# Patient Record
Sex: Female | Born: 1965 | Race: White | Hispanic: No | Marital: Married | Smoking: Never smoker
Health system: Southern US, Community
[De-identification: ages and names within clinical notes are randomized; demographics above are authoritative.]

## PROBLEM LIST (undated history)

## (undated) DIAGNOSIS — F32A Depression, unspecified: Secondary | ICD-10-CM

## (undated) DIAGNOSIS — F419 Anxiety disorder, unspecified: Secondary | ICD-10-CM

## (undated) DIAGNOSIS — F329 Major depressive disorder, single episode, unspecified: Secondary | ICD-10-CM

## (undated) DIAGNOSIS — C50919 Malignant neoplasm of unspecified site of unspecified female breast: Secondary | ICD-10-CM

## (undated) DIAGNOSIS — F99 Mental disorder, not otherwise specified: Secondary | ICD-10-CM

## (undated) DIAGNOSIS — E039 Hypothyroidism, unspecified: Secondary | ICD-10-CM

## (undated) HISTORY — PX: TONSILLECTOMY: SUR1361

## (undated) HISTORY — DX: Depression, unspecified: F32.A

## (undated) HISTORY — DX: Mental disorder, not otherwise specified: F99

## (undated) HISTORY — DX: Hypothyroidism, unspecified: E03.9

## (undated) HISTORY — DX: Major depressive disorder, single episode, unspecified: F32.9

---

## 2008-03-14 ENCOUNTER — Ambulatory Visit: Payer: Self-pay | Admitting: Obstetrics & Gynecology

## 2010-01-01 ENCOUNTER — Ambulatory Visit: Payer: Self-pay | Admitting: Otolaryngology

## 2010-12-13 ENCOUNTER — Emergency Department: Payer: Self-pay | Admitting: Emergency Medicine

## 2011-05-28 ENCOUNTER — Ambulatory Visit: Payer: Self-pay | Admitting: Otolaryngology

## 2011-07-15 HISTORY — PX: BREAST SURGERY: SHX581

## 2011-09-26 DIAGNOSIS — D0512 Intraductal carcinoma in situ of left breast: Secondary | ICD-10-CM | POA: Insufficient documentation

## 2011-09-27 ENCOUNTER — Telehealth (HOSPITAL_COMMUNITY): Payer: Self-pay | Admitting: Licensed Clinical Social Worker

## 2011-09-27 ENCOUNTER — Encounter (HOSPITAL_COMMUNITY): Payer: Self-pay | Admitting: Licensed Clinical Social Worker
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
## 2011-09-28 ENCOUNTER — Inpatient Hospital Stay (HOSPITAL_COMMUNITY)
Admission: RE | Admit: 2011-09-28 | Discharge: 2011-10-03 | DRG: 885 | Disposition: A | Discharge status: Home / Self Care | Payer: Managed Care, Other (non HMO) | Source: Ambulatory Visit | Attending: Psychiatry | Admitting: Psychiatry

## 2011-09-28 DIAGNOSIS — F3289 Other specified depressive episodes: Secondary | ICD-10-CM

## 2011-09-28 DIAGNOSIS — F329 Major depressive disorder, single episode, unspecified: Secondary | ICD-10-CM

## 2011-09-28 DIAGNOSIS — Z91199 Patient's noncompliance with other medical treatment and regimen due to unspecified reason: Secondary | ICD-10-CM

## 2011-09-28 DIAGNOSIS — Z9119 Patient's noncompliance with other medical treatment and regimen: Secondary | ICD-10-CM

## 2011-09-28 DIAGNOSIS — G8929 Other chronic pain: Secondary | ICD-10-CM

## 2011-09-28 DIAGNOSIS — E039 Hypothyroidism, unspecified: Secondary | ICD-10-CM

## 2011-09-28 DIAGNOSIS — F200 Paranoid schizophrenia: Principal | ICD-10-CM | POA: Diagnosis present

## 2011-09-28 DIAGNOSIS — Z79899 Other long term (current) drug therapy: Secondary | ICD-10-CM

## 2011-09-28 DIAGNOSIS — C50919 Malignant neoplasm of unspecified site of unspecified female breast: Secondary | ICD-10-CM | POA: Diagnosis present

## 2011-09-28 DIAGNOSIS — M549 Dorsalgia, unspecified: Secondary | ICD-10-CM

## 2011-09-28 MED ORDER — RISPERIDONE 0.5 MG PO TBDP
0.5000 mg | ORAL_TABLET | ORAL | Status: DC | PRN
  Administered 2011-09-29: 0.5 mg via ORAL
  Filled 2011-09-28: qty 1

## 2011-09-28 MED ORDER — ACETAMINOPHEN 325 MG PO TABS
650.0000 mg | ORAL_TABLET | Freq: Four times a day (QID) | ORAL | Status: DC | PRN
  Administered 2011-09-28 – 2011-10-02 (×6): 650 mg via ORAL

## 2011-09-28 MED ORDER — MAGNESIUM HYDROXIDE 400 MG/5ML PO SUSP: 30.0000 mL | Freq: Every day | ORAL | Status: DC | PRN

## 2011-09-28 MED ORDER — RISPERIDONE 2 MG PO TBDP
2.0000 mg | ORAL_TABLET | Freq: Every day | ORAL | Status: DC
  Administered 2011-09-28 – 2011-09-29 (×2): 2 mg via ORAL
  Filled 2011-09-28 (×4): qty 1
  Filled 2011-09-28: qty 2

## 2011-09-28 MED ORDER — ALUM & MAG HYDROXIDE-SIMETH 200-200-20 MG/5ML PO SUSP: 30.0000 mL | ORAL | Status: DC | PRN

## 2011-09-28 MED ORDER — LEVOTHYROXINE SODIUM 75 MCG PO TABS
75.0000 ug | ORAL_TABLET | Freq: Every day | ORAL | Status: DC
  Administered 2011-09-28 – 2011-10-03 (×6): 75 ug via ORAL
  Filled 2011-09-28 (×8): qty 1

## 2011-09-28 MED ORDER — TRAMADOL HCL 50 MG PO TABS
50.0000 mg | ORAL_TABLET | Freq: Two times a day (BID) | ORAL | Status: DC
  Administered 2011-09-28 – 2011-09-29 (×3): 50 mg via ORAL
  Filled 2011-09-28 (×7): qty 1
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
## 2011-09-28 NOTE — Progress Notes (Signed)
Patient ID: Cassie Ruiz, female   DOB: 17-Apr-1966, 46 y.o.   MRN: [id number]   [facility] Group Notes:  (Counselor/Nursing/MHT/Case Management/Adjunct)  09/28/2011 11 AM  Type of Therapy:  Group Therapy, Dance/Movement Therapy   Participation Level:  Did Not Attend  Kym Groom

## 2011-09-28 NOTE — H&P (Signed)
  Pt was seen by me today and I agree with the key elements documented in H&P.  

## 2011-09-28 NOTE — Progress Notes (Addendum)
Pt has been isolative to her room today   She believes she has necrotic faschitis and breast cancer  She has not attended groups and does not interact with others  She has a tense affect and is anxious  Her appetite is good and she has otherwise been cooperative  Verbal support given  Medications administered and effectiveness monitored  Q 15 min checks  Pt safe at present  Addendum   Pt does have radiation tatoos on her breast and claims to start treatment on march 27 for breast cancer

## 2011-09-28 NOTE — Progress Notes (Signed)
Pt is 46 year old Caucasion female admitted to the services of Dr. Allena Katz for delusional thinking and paranoia.  Pt states that two people, whom she names, are trying to kill her.  Pt felt that they put necrotizing fasciitis in her tramadol and told ED that she had to have IV vancomycin or she would just lay down and die.  Pt's husband stated that Pt started decompensating when her grandmother, whom she was the primary caregiver for, died in June 26, 2023.  Pt was hospitalized in January at [facility] for similar complaints.  Pt stopped taking her Risperdal soon after this due to side effects that she found unpleasant.  Pt is cooperative on admission, no complaints voiced.  Pt lists  Her husband Cassie Ruiz as her emergency contact [phone]

## 2011-09-28 NOTE — Tx Team (Addendum)
Initial Interdisciplinary Treatment Plan  PATIENT STRENGTHS: (choose at least two) Average or above average intelligence Capable of independent living  PATIENT STRESSORS: Health problems Loss of Grandmother* Marital or family conflict Medication change or noncompliance   PROBLEM LIST: Problem List/Patient Goals Date to be addressed Date deferred Reason deferred Estimated date of resolution  Psychosis, paranoia  09/28/11                                                      DISCHARGE CRITERIA:  Improved stabilization in mood, thinking, and/or behavior Motivation to continue treatment in a less acute level of care Need for constant or close observation no longer present Verbal commitment to aftercare and medication compliance  PRELIMINARY DISCHARGE PLAN: Attend aftercare/continuing care group Return to previous living arrangement  PATIENT/FAMIILY INVOLVEMENT: This treatment plan has been presented to and reviewed with the patient, Rees Matura.  The patient and family have been given the opportunity to ask questions and make suggestions.  Juliann Pares 09/28/2011, 3:14 AM
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
## 2011-09-29 DIAGNOSIS — F200 Paranoid schizophrenia: Secondary | ICD-10-CM

## 2011-09-29 LAB — DIFFERENTIAL
Basophils Absolute: 0.1 10*3/uL (ref 0.0–0.1)
Eosinophils Relative: 1 % (ref 0–5)
Lymphocytes Relative: 22 % (ref 12–46)
Monocytes Absolute: 1.1 10*3/uL — ABNORMAL HIGH (ref 0.1–1.0)
Monocytes Relative: 9 % (ref 3–12)

## 2011-09-29 LAB — CBC
HCT: 35.8 % — ABNORMAL LOW (ref 36.0–46.0)
Hemoglobin: 12 g/dL (ref 12.0–15.0)
MCHC: 33.5 g/dL (ref 30.0–36.0)
MCV: 89.3 fL (ref 78.0–100.0)
RDW: 14.2 % (ref 11.5–15.5)
WBC: 11.4 10*3/uL — ABNORMAL HIGH (ref 4.0–10.5)

## 2011-09-29 LAB — URINALYSIS, ROUTINE W REFLEX MICROSCOPIC
Glucose, UA: NEGATIVE mg/dL
Ketones, ur: NEGATIVE mg/dL
Leukocytes, UA: NEGATIVE
Nitrite: NEGATIVE
Specific Gravity, Urine: 1.027 (ref 1.005–1.030)
pH: 6.5 (ref 5.0–8.0)

## 2011-09-29 LAB — COMPREHENSIVE METABOLIC PANEL
ALT: 19 U/L (ref 0–35)
Albumin: 3.6 g/dL (ref 3.5–5.2)
Alkaline Phosphatase: 76 U/L (ref 39–117)
Potassium: 4 mEq/L (ref 3.5–5.1)
Sodium: 141 mEq/L (ref 135–145)
Total Protein: 6.6 g/dL (ref 6.0–8.3)

## 2011-09-29 LAB — PREGNANCY, URINE: Preg Test, Ur: NEGATIVE

## 2011-09-29 MED ORDER — TRAMADOL HCL 50 MG PO TABS
100.0000 mg | ORAL_TABLET | Freq: Two times a day (BID) | ORAL | Status: DC
  Administered 2011-09-29 – 2011-10-03 (×8): 100 mg via ORAL
  Filled 2011-09-29 (×10): qty 2

## 2011-09-29 MED ORDER — TRAZODONE HCL 100 MG PO TABS
100.0000 mg | ORAL_TABLET | Freq: Every day | ORAL | Status: DC
  Administered 2011-09-29 – 2011-10-02 (×4): 100 mg via ORAL
  Filled 2011-09-29 (×6): qty 1
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
## 2011-09-29 NOTE — Progress Notes (Signed)
Pt resting in bed with eyes closed when I took over her care at 2330.  Respirations even and unlabored, no distress noted.

## 2011-09-29 NOTE — Progress Notes (Signed)
Recreation Therapy Notes  09/29/2011         Time: 0930      Group Topic/Focus: The focus of this group is on discussing various styles of communication and communicating assertively using 'I' (feeling) statements.  Participation Level: Active  Participation Quality: Appropriate and Attentive  Affect: Appropriate  Cognitive: Alert   Additional Comments: Patient able to participate appropriately in the activity, able to formulate appropriate I-statements to share her feelings with the group. Patient reports feeling scared about her upcoming radiation treatment, which will begin on the 27th.   Jamyron Redd 09/29/2011 11:26 AM

## 2011-09-29 NOTE — Progress Notes (Signed)
Pt. Went to recreational therapy and reported that she enjoyed it.  No concerns or issues voiced at present.  Writer will continue to assess.
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
## 2011-09-29 NOTE — Progress Notes (Signed)
Patient ID: Cassie Ruiz, female   DOB: October 13, 1965, 46 y.o.   MRN: [id number] She has been up and about and to parts of the groups. She has requested and received PRN medication for anxiety. She denies having delusions .
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
## 2011-09-29 NOTE — Progress Notes (Signed)
[facility] Group Notes:  (Counselor/Nursing/MHT/Case Management/Adjunct)  09/29/2011 2:14 PM 1:15 PM Group  Type of Therapy:  Group Therapy  Participation Level:  Active  Participation Quality:  Appropriate, Attentive and Supportive  Affect:  Appropriate  Cognitive:  Alert and Oriented  Insight:  Good  Engagement in Group:  Good  Engagement in Therapy:  Good  Modes of Intervention:  Problem-solving, Support and Exploratory  Summary of Progress/Problems: Pt verbalized that she was learning how to take a stand. Pt stated that she has been able to do that by reaching out for help. Pt verbalized that she would like for her husband and her mother to listen to her more. Pt states that she feels her mom is one of her supports.   Johnna Bollier, Randal Buba 09/29/2011, 2:14 PM

## 2011-09-29 NOTE — BHH Counselor (Signed)
Adult Services Patient-Family Contact/Session  Attendees:  Hardie Shackleton Tangen(Husband), Osborn Coho  Goal(s):  Collateral   Safety Concerns:    Narrative:  Husband reported that pt had a nervous breakdown in January. Husband feels as though pt has been experiencing symptoms for years. Husband reports that pt's niece was killed four years ago in [place] and that her brother recently started drinking. Husband reports that pt took care of her grandmother and her aunt for ten years. Husband reports that pt was staying up all night, not sleeping, and was becoming forgetful. Husband verbalized that him and pt were able to attend one marriage counseling session. Husband did report that pt stopped taking her medications. Husband reports that he has not and is not currently cheating on his wife. He reports that he has been married to wife for 27 years and has been with his wife for 33 years. Husband reports that brother has a mental illness at this time. Husband reports that wife recently had cancer removed from her left breast and is expected to follow up with the Dr. Hilda Lias in [place] on March 28th.   Barrier(s):  Husband reports that wife is extremely paranoid.   Interventions:  Requested collateral  Recommendation(s):    Follow-up Required:  No  Explanation:    Gerrit Heck 09/29/2011, 10:23 PM

## 2011-09-30 MED ORDER — RISPERIDONE 2 MG PO TABS
2.0000 mg | ORAL_TABLET | Freq: Every day | ORAL | Status: DC
  Administered 2011-09-30 – 2011-10-02 (×3): 2 mg via ORAL
  Filled 2011-09-30 (×4): qty 1

## 2011-09-30 NOTE — Progress Notes (Signed)
[facility] Group Notes:  (Counselor/Nursing/MHT/Case Management/Adjunct)  09/30/2011 4:02 PM  Type of Therapy:  Group Therapy  Participation Level:  Active  Participation Quality:  Attentive and Sharing  Affect:  Blunted  Cognitive:  Oriented  Insight:  Good  Engagement in Group:  Good  Engagement in Therapy:  Good  Modes of Intervention:  Education, Problem-solving, Support and Exploration  Summary of Progress/Problems: Patient talked about difficulty in relationship with her brother after he came back from [place]. She stated they had some trying times and she did not know it was related to his PTSD. Since then they have repaired their relationship. She also talked about her niece being murdered. She wishes that she had had them stay with her and they would have not been in that area.   [name] 09/30/2011, 4:02 PM

## 2011-09-30 NOTE — Progress Notes (Signed)
Pt. Went outside with the group.Pt. Does not feel that H is having an affair b/c " he still loves me" Denies SI, HI, & AVH @ this time.Affect is blunted & mood sad. Continues on 15 minute checks. Pt. Safety maintained.

## 2011-09-30 NOTE — Progress Notes (Signed)
Patient ID: Shilo Philipson, female   DOB: 1966/02/22, 46 y.o.   MRN: [id number] Twisha presents pleasant on approach, fully alert, cooperative, and pleasant,. Reports that she feels much better today after getting a good night sleep. She rates her depression at 3/10 are 1 scale if 10 is the worst symptoms. Reports her appetite is good, anxiety as 3/10, hopelessness 3/10, and denies any suicidal thoughts.  She says she was having thoughts yesterday that people were plotting against her, that her family wanted to harm her but says that these have resolved and she is not having any of these thoughts today. She felt frightened and guarded in the cafeteria yesterday but today she says she feels fine going to meals with the other patients. She feels she is getting a lot out of group therapy and is enjoying it. She has not spoken with her husband.  TSH 0.920, free T4-1 0.53. Urine pregnancy test is negative, routine urinalysis unremarkable.  Plan: Continue current medications. Discussed in team meeting and the counselor will try to contact her husband. Estimated length of stay is one to 2 days.

## 2011-09-30 NOTE — Progress Notes (Signed)
Currently resting quietly in bed with eyes closed. Respirations are even and unlabored. No acute distress noted. Safety has been maintained with Q15 minute observation. Will continue current POC. 

## 2011-09-30 NOTE — Progress Notes (Signed)
Patient ID: Cassie Ruiz, female   DOB: Jun 27, 1966, 46 y.o.   MRN: [id number]

## 2011-09-30 NOTE — Progress Notes (Signed)
Pt. In bed, eyes closed, resp. Even no distress noted

## 2011-09-30 NOTE — Progress Notes (Signed)
Patient ID: Cassie Ruiz, female   DOB: 08/27/1965, 46 y.o.   MRN: [id number] She has been up and about interacting with peers and staff. She denies hearing voices  And is not reporting paranoid  Thoughts. She has requested and received prn tylenol for head ache.

## 2011-09-30 NOTE — Progress Notes (Signed)
Patient ID: Cassie Ruiz, female   DOB: 10/04/1965, 46 y.o.   MRN: [id number] Pt. In bed, eyes closed, no distress noted, eyes closed resp. Even, unlabored. Staff will monitor q15min for safety. Pt. Remains safe on the unit.

## 2011-09-30 NOTE — Discharge Planning (Signed)
Met with patient in Aftercare Planning Group.   She stated she feels that she is close to being ready to discharge, and does not feel a need to transfer to [facility] as had previously requested.  She stated she will go home with her husband without any problem at all, contrary to what she reported yesterday.  She did agree that she wants Case Manager to set a counseling appointment for her and husband.  Case Manager set appointment with therapist Joaquin Music at [facility] in [place], [place] for 10/09/11 at 9am.  Also confirmed that patient has a follow-up appointment with Bosie Clos, NP, at [facility] in [place] on 10/06/11 at 11am.  Consents completed for patient to sign at discharge.  Ambrose Mantle, LCSW 09/30/2011, 9:50 AM

## 2011-10-01 MED ORDER — TRAMADOL HCL 50 MG PO TABS
100.0000 mg | ORAL_TABLET | ORAL | Status: AC
  Administered 2011-10-01: 100 mg via ORAL
  Filled 2011-10-01: qty 2

## 2011-10-01 NOTE — Progress Notes (Signed)
Patient ID: Cassie Ruiz, female   DOB: 1965/10/02, 46 y.o.   MRN: [id number] She has been up and to groups, interacting more with peers and staff. Denies hearing voices today and reported no paranoia  Thoughts.  She has chronic back pain that she has a standing pain medication ordered.

## 2011-10-01 NOTE — Progress Notes (Signed)
[facility] MD Progress Note  10/01/2011 10:06 AM  Diagnosis:  Axis I: Schizophrenia - Paranoid Type.   The patient was seen today and reports the following:   ADL's: Intact.  Sleep: The patient reports to sleeping reasonably well but had some difficulty falling asleep last night due to worrying about her cancer.  Appetite: The patient reports a good appetite today.   Mild>(1-10) >Severe  Hopelessness (1-10): 3  Depression (1-10): 2-3  Anxiety (1-10): 3   Suicidal Ideation: The patient adamantly denies any suicidal ideations today.  Plan: No  Intent: No  Means: No  Homicidal Ideation: The patient adamantly denies any homicidal ideations today.  Plan: No  Intent: No.  Means: No   General Appearance/Behavior: Friendly and cooperative today.  Eye Contact: Good.  Speech: Appropriate in rate and volume with no pressuring noted today.  Motor Behavior: wnl.  Level of Consciousness: Alert and Oriented x 3.  Mental Status: Alert and Oriented x 3.  Mood: Mildly depressed.  Affect: Mildly constricted today.  Anxiety Level: Mild anxiety reported today.  Thought Process: wnl.  Thought Content: The patient denies any auditory or visual hallucinations today. She also denies any paranoid delusions today.  Perception: wnl.  Judgment: Good.  Insight: Good.  Cognition: Oriented to person, place and time. Sleep:  Number of Hours: 6.5    Vital Signs:Blood pressure 119/72, pulse 105, temperature 98.2 F (36.8 C), temperature source Oral, resp. rate 18.  Current Medications: Current Facility-Administered Medications  Medication Dose Route Frequency Provider Last Rate Last Dose  . acetaminophen (TYLENOL) tablet 650 mg  650 mg Oral Q6H PRN Wonda Cerise, MD   650 mg at 09/30/11 1319  . alum & mag hydroxide-simeth (MAALOX/MYLANTA) 200-200-20 MG/5ML suspension 30 mL  30 mL Oral Q4H PRN Wonda Cerise, MD      . levothyroxine (SYNTHROID, LEVOTHROID) tablet 75 mcg  75 mcg Oral QAC breakfast Ronny Bacon,  MD   75 mcg at 10/01/11 1610  . magnesium hydroxide (MILK OF MAGNESIA) suspension 30 mL  30 mL Oral Daily PRN Wonda Cerise, MD      . risperiDONE (RISPERDAL M-TABS) disintegrating tablet 0.5 mg  0.5 mg Oral Q4H PRN Mickie D. Adams, PA   0.5 mg at 09/29/11 0215  . risperiDONE (RISPERDAL) tablet 2 mg  2 mg Oral QHS Viviann Spare, NP   2 mg at 09/30/11 2117  . traMADol (ULTRAM) tablet 100 mg  100 mg Oral BID Curlene Labrum Joell Buerger, MD   100 mg at 10/01/11 0810  . traMADol (ULTRAM) tablet 100 mg  100 mg Oral NOW Curlene Labrum Hajar Penninger, MD      . traZODone (DESYREL) tablet 100 mg  100 mg Oral QHS Sanjuana Kava, NP   100 mg at 09/30/11 2118  . DISCONTD: risperiDONE (RISPERDAL M-TABS) disintegrating tablet 2 mg  2 mg Oral QHS Curlene Labrum Daleigh Pollinger, MD   2 mg at 09/29/11 2148   Lab Results:  Results for orders placed during the hospital encounter of 09/28/11 (from the past 48 hour(s))  URINALYSIS, ROUTINE W REFLEX MICROSCOPIC     Status: Normal   Collection Time   09/29/11  2:04 PM      Component Value Range Comment   Color, Urine YELLOW  YELLOW     APPearance CLEAR  CLEAR     Specific Gravity, Urine 1.027  1.005 - 1.030     pH 6.5  5.0 - 8.0     Glucose, UA NEGATIVE  NEGATIVE (mg/dL)  Hgb urine dipstick NEGATIVE  NEGATIVE     Bilirubin Urine NEGATIVE  NEGATIVE     Ketones, ur NEGATIVE  NEGATIVE (mg/dL)    Protein, ur NEGATIVE  NEGATIVE (mg/dL)    Urobilinogen, UA 1.0  0.0 - 1.0 (mg/dL)    Nitrite NEGATIVE  NEGATIVE     Leukocytes, UA NEGATIVE  NEGATIVE  MICROSCOPIC NOT DONE ON URINES WITH NEGATIVE PROTEIN, BLOOD, LEUKOCYTES, NITRITE, OR GLUCOSE <1000 mg/dL.  PREGNANCY, URINE     Status: Normal   Collection Time   09/29/11  2:04 PM      Component Value Range Comment   Preg Test, Ur NEGATIVE  NEGATIVE    CALCIUM, IONIZED     Status: Normal   Collection Time   09/29/11  7:58 PM      Component Value Range Comment   Calcium, Ion 1.28  1.12 - 1.32 (mmol/L)   COMPREHENSIVE METABOLIC PANEL     Status: Normal    Collection Time   09/29/11  8:01 PM      Component Value Range Comment   Sodium 141  135 - 145 (mEq/L)    Potassium 4.0  3.5 - 5.1 (mEq/L)    Chloride 105  96 - 112 (mEq/L)    CO2 28  19 - 32 (mEq/L)    Glucose, Bld 97  70 - 99 (mg/dL)    BUN 16  6 - 23 (mg/dL)    Creatinine, Ser 1.61  0.50 - 1.10 (mg/dL)    Calcium 9.2  8.4 - 10.5 (mg/dL)    Total Protein 6.6  6.0 - 8.3 (g/dL)    Albumin 3.6  3.5 - 5.2 (g/dL)    AST 16  0 - 37 (U/L)    ALT 19  0 - 35 (U/L)    Alkaline Phosphatase 76  39 - 117 (U/L)    Total Bilirubin 0.3  0.3 - 1.2 (mg/dL)    GFR calc non Af Amer >90  >90 (mL/min)    GFR calc Af Amer >90  >90 (mL/min)   CBC     Status: Abnormal   Collection Time   09/29/11  8:01 PM      Component Value Range Comment   WBC 11.4 (*) 4.0 - 10.5 (K/uL)    RBC 4.01  3.87 - 5.11 (MIL/uL)    Hemoglobin 12.0  12.0 - 15.0 (g/dL)    HCT 09.6 (*) 04.5 - 46.0 (%)    MCV 89.3  78.0 - 100.0 (fL)    MCH 29.9  26.0 - 34.0 (pg)    MCHC 33.5  30.0 - 36.0 (g/dL)    RDW 40.9  81.1 - 91.4 (%)    Platelets 341  150 - 400 (K/uL)   DIFFERENTIAL     Status: Abnormal   Collection Time   09/29/11  8:01 PM      Component Value Range Comment   Neutrophils Relative 67  43 - 77 (%)    Neutro Abs 7.7  1.7 - 7.7 (K/uL)    Lymphocytes Relative 22  12 - 46 (%)    Lymphs Abs 2.5  0.7 - 4.0 (K/uL)    Monocytes Relative 9  3 - 12 (%)    Monocytes Absolute 1.1 (*) 0.1 - 1.0 (K/uL)    Eosinophils Relative 1  0 - 5 (%)    Eosinophils Absolute 0.1  0.0 - 0.7 (K/uL)    Basophils Relative 0  0 - 1 (%)    Basophils Absolute 0.1  0.0 - 0.1 (K/uL)   T3, FREE     Status: Normal   Collection Time   09/29/11  8:01 PM      Component Value Range Comment   T3, Free 2.9  2.3 - 4.2 (pg/mL)   T4, FREE     Status: Normal   Collection Time   09/29/11  8:01 PM      Component Value Range Comment   Free T4 1.53  0.80 - 1.80 (ng/dL)    Time was spent today discussing with the patient her current symptoms.  The patient  reports that she is having some mild depression and anxiety but denies any suicidal or homicidal ideations.  She also states that she is not experiencing any paranoid delusions and does not feel she is in any danger.  It was discussed with the patient the possibility of discharging her this week.  She states that her husband can pick her up on Friday and we will tentatively plan her discharge for that day.  Treatment Plan Summary:  1. Daily contact with patient to assess and evaluate symptoms and progress in treatment  2. Medication management  3. The patient will deny suicidal ideations or homicidal ideations for 48 hours prior to discharge and have a depression and anxiety rating of 3 or less. The patient will also deny any auditory or visual hallucinations or delusional thinking.   Plan:  1. Will continue the patient on her current medications.  2. Will continue to monitor.  3. Laboratory studies reviewed.  4. Will order a one time dosage of an additional Tramadol 100 mgs po now for back pain. 5. Possible discharge on Friday.  Andry Bogden 10/01/2011, 10:06 AM

## 2011-10-01 NOTE — Progress Notes (Signed)
Resting quietly in bed with eyes closed. Respirations even and unlabored. No acute distress noted. Safety has been maintained with Q15 minute observation. Will continue current POC. 

## 2011-10-01 NOTE — Progress Notes (Signed)
Recreation Therapy Notes  10/01/2011         Time: 0930      Group Topic/Focus: The focus of this group is on enhancing the patient's understanding of leisure, barriers to leisure, and the importance of engaging in positive leisure activities upon discharge for improved total health.  Participation Level: Active  Participation Quality: Appropriate and Attentive  Affect: Blunted  Cognitive: Oriented   Additional Comments: Patient able to identify positive leisure activities to engage in at discharge.   Shakeia Krus 10/01/2011 10:06 AM

## 2011-10-01 NOTE — Discharge Planning (Signed)
Met with patient in Aftercare Planning Group.   She stated that she needs to arrange transportation with her husband in advance, as he works late shift.   So if we do plan to discharge her tomorrow, she needs to know today to arrange for him to get her tomorrow morning.  Patient appears stable, affect is broader, mood significantly less depressed.  Told her about the 2 appointments made, and she stated she can make it to both those, and wants to do so.  No case management needs today.  Ambrose Mantle, LCSW 10/01/2011, 10:10 AM

## 2011-10-01 NOTE — Progress Notes (Signed)
Patient ID: Cassie Ruiz, female   DOB: Jan 29, 1966, 45 y.o.   MRN: [id number] Patient pleasant and cooperative. Stated she is doing much better. Stated she will be d/c on Friday and she is a little anxious about outpt tx. Denies SI or any delusions of paranoia. Compliant with medications.

## 2011-10-02 NOTE — Discharge Planning (Signed)
Met with patient in Aftercare Planning Group.   Discussed using support groups as essential part of wellness & recovery action plan.  Patient planning discharge at approximately 9:30AM tomorrow, based on feedback given to her in Treatment Team.  Her husband will pick up, transport home.  Patient has follow up set as discussed in earlier notes.  On 3/27 she will be evaluated for her radiation therapy, and when her follow-up treatments start on 3/28, these will occur Monday-Friday daily for 7 weeks.  No case management needs today.  Ambrose Mantle, LCSW 10/02/2011, 11:34 AM

## 2011-10-02 NOTE — Tx Team (Signed)
Interdisciplinary Treatment Plan Update (Adult)  Date:  10/02/2011  Time Reviewed:  10:15AM-11:00AM  Progress in Treatment: Attending groups:  Yes Participating in groups:    Yes Taking medication as prescribed:    Yes Tolerating medication:   Yes Family/Significant other contact made:  Yes Patient understands diagnosis:   Yes Discussing patient identified problems/goals with staff:   Yes Medical problems stabilized or resolved:   Yes Denies suicidal/homicidal ideation:  Yes Issues/concerns per patient self-inventory:   None Other:    New problem(s) identified: No, Describe:    Reason for Continuation of Hospitalization: Medication stabilization  Interventions implemented related to continuation of hospitalization:  Medication monitoring and adjustment, safety checks Q15 min., suicide risk assessment, group therapy, psychoeducation, collateral contact, aftercare planning, ongoing physician assessments, medication education  Additional comments:  Not applicable  Estimated length of stay:  1 day  Discharge Plan:  Follow up with [facility] for med mgmt and [facility] for therapy with husband, pick up by husband, will live with him as before hospitalization  New goal(s):  Not applicable  Review of initial/current patient goals per problem list:   1.  Goal(s):  Reduce paranoia to baseline as reported by patient and family identified as supportive.  Met:  Yes  Target date:  By Discharge   As evidenced by:  Paranoia greatly reduced.  2.  Goal(s):  Would like to be admitted to [facility].  Met:  Yes  Target date:  By Discharge   As evidenced by:  No need, ready for D/C tomorrow.  3.  Goal(s):  Increase coping skills to deal with breast cancer and to deal with situation at home  Met:  Yes  Target date:  By Discharge   As evidenced by:  Will continue to attend groups and improve coping skills until  D/C.  4.  Goal(s):  Would like to be home for 3/25 psychiatrist appointment and 3/27 start of radiation therapy.  Met:  Yes  Target date:  By Discharge   As evidenced by:  Will D/C 3/22 in time for these.  Attendees: Patient:  Cassie Ruiz  10/02/2011 10:15AM-11:00AM  Family:     Physician:  Dr. Harvie Heck Readling 10/02/2011 10:15AM-11:00AM  Nursing:   Izola Price, RN 10/02/2011 10:15AM -11:00AM   Case Manager:  Ambrose Mantle, LCSW 10/02/2011 10:15AM-11:00AM  Counselor:  Veto Kemps, MT-BC 10/02/2011 10:15AM-11:00AM  Other:   Lynann Bologna, NP 10/02/2011 10:15AM-11:00AM  Other:      Other:      Other:       Scribe for Treatment Team:   Sarina Ser, 10/02/2011, 10:15AM-11:15AM

## 2011-10-02 NOTE — Progress Notes (Signed)
[facility] Group Notes:  (Counselor/Nursing/MHT/Case Management/Adjunct)  10/02/2011 12:42 PM  Type of Therapy:  Group Therapy  Participation Level:  Active  Participation Quality:  Appropriate  Affect:  Appropriate  Cognitive:  Appropriate  Insight:  Good  Engagement in Group:  Good  Engagement in Therapy:  Good  Modes of Intervention:  Education, Problem-solving and Support  Summary of Progress/Problems: Patient is eager to be discharged tomorrow. She stated that she is now accepting her illness and plans to take her medications. She stated that her medicine had been decreased and she is feeling good on this so she will keep taking it. Also had been worried about what people would say and if they would treat her differently but feels better able to cope with this. Questioning whether this would keep her from a job. Patient stated that she had been stressed over mother's death and niece's murder that she somehow lost touch with reality. She has talked to her husband and feels supported by him. He plans to go to all her cancer treatments.    Cassie Ruiz 10/02/2011, 12:42 PM

## 2011-10-02 NOTE — Progress Notes (Signed)
Patient up and active in the milieu today.  Has attended groups and is interacting well with staff and peers.  Tolerating medications well.  Attended treatment team this morning and expressed desire to be discharged tomorrow morning.  States it would be a hardship to have to leave much later due to the travel time and her husbands work schedule.  Has been pleasant and cooperative.  Has been setting goals and is anxious to get started on her next phase of treatment.

## 2011-10-02 NOTE — Progress Notes (Signed)
[facility] Group Notes:  (Counselor/Nursing/MHT/Case Management/Adjunct)  10/02/2011 8:06 AM  Type of Therapy:  Group Therapy  10/01/11  Participation Level:  Active  Participation Quality:  Appropriate  Affect:  Blunted  Cognitive:  Oriented  Insight:  Good  Engagement in Group:  Good  Engagement in Therapy:  Good  Modes of Intervention:  Education and Support  Summary of Progress/Problems: Patient was asking about the diagnosis of Schizophrenia at age 46. Seemed accepting of information. Recognizing need for medications and related to peers as they discussed voices and anxiety. She stated she had gone 9 months without getting out of the house. She reported that this was when she was staying with her grandmother and her husband would take her out and she would stay with her aunt who was also living in the house. Talked about needing to get busier now that she was not the caretaker for the family.   Arma Reining, [name] 10/02/2011, 8:06 AM

## 2011-10-02 NOTE — Progress Notes (Signed)
Patient ID: Cassie Ruiz, female   DOB: 1966-02-27, 46 y.o.   MRN: [id number]   Cassie Ruiz presents fully alert, well dressed and well groomed. Reports that she had slept good last night, and her appetite is good. She is requesting to go home tomorrow and is looking forward to this. She reports her anxiety level is a 2/10, and her depression is a 2/10, hopelessness 2/10 on a 1-10 scale if 10 is the worst symptoms. She attributes her anxiety and depression in anticipation regarding her treatment for breast cancer.Denies symptoms of paranoia, denies suicidal and homicidal thoughts. She continues to have some mild left-sided rib pain and feels a nodule there and will not have all of her test results until she reports for treatment next week when she will see her oncologist.  She denies any suicidal thoughts. Says that she does not feel in any danger. She had some concerns yesterday about taking medications and Dr. Allena Katz had spoken with her to reassure her that the metabolic effects of Risperdal we'll not interfere with her breast cancer treatment. Previously she had had an exacerbation of symptoms after she stopped taking medication.  She plans on following up with the [facility] in [place] where she has a Social research officer, government.  Plan:  Seen in team meeting with the team who agree that she is ready for discharge. I will validate her home treatment plan with Tramadol via her pharmacy, and will get her an outpatient followup appt with her PCP.

## 2011-10-03 MED ORDER — LEVOTHYROXINE SODIUM 75 MCG PO TABS: 75.0000 ug | ORAL_TABLET | Freq: Every day | ORAL | Status: DC

## 2011-10-03 MED ORDER — TRAMADOL HCL 50 MG PO TABS: 100.0000 mg | ORAL_TABLET | Freq: Two times a day (BID) | ORAL | Status: DC

## 2011-10-03 MED ORDER — RISPERIDONE 2 MG PO TABS: 2.0000 mg | ORAL_TABLET | Freq: Every day | ORAL | Status: DC

## 2011-10-03 MED ORDER — TRAMADOL HCL 50 MG PO TABS: 100.0000 mg | ORAL_TABLET | Freq: Two times a day (BID) | ORAL | Status: AC

## 2011-10-03 MED ORDER — TRAZODONE HCL 100 MG PO TABS: 100.0000 mg | ORAL_TABLET | Freq: Every day | ORAL | Status: DC

## 2011-10-03 NOTE — Progress Notes (Signed)
[facility] Group Notes:  (Counselor/Nursing/MHT/Case Management/Adjunct)  10/03/2011 10:39 AM 9:30 Group  Type of Therapy:  Group Therapy  Participation Level:  Active  Participation Quality:  Attentive, Sharing and Supportive  Affect:  Appropriate  Cognitive:  Alert and Oriented  Insight:  Good  Engagement in Group:  Good  Engagement in Therapy:  Good  Modes of Intervention:  Education, Problem-solving, Support and Exploratory  Summary of Progress/Problems: Pt was able to discuss her feelings as it related to her recovering. Pt stated that she could utilize her mother, husband, and church family as support. Pt stated that she going to try to stop thinking negatively and work on thinking about the positive things. Pt stated one main positive thing that has occurred is that her cancer was removed and that she will be receiving radiation treatment.   Cassie Ruiz, Randal Buba 10/03/2011, 10:39 AM

## 2011-10-03 NOTE — Progress Notes (Signed)
Patient ID: Cassie Ruiz, female   DOB: [birth year], 46 y.o.   MRN: [id number] Writer reviewed pt discharge instructions including medications, follow up care and crisis intervention. Pt verbally acknowledged understanding of instructions and states that she has no reservations about leaving [facility] at this time. Pt mood and affect are appropriate to the situation. Pt denies SI/HI and AVH. Pt belongings returned from locker and pt is released into her own care.

## 2011-10-03 NOTE — Progress Notes (Signed)
[facility] Case Management Discharge Plan:  Will you be returning to the same living situation after discharge: Yes,  with husband At discharge, do you have transportation home?:Yes,  husband to pick up Do you have the ability to pay for your medications:Yes,  insurance and family income  Interagency Information:     Release of information consent forms completed and in the chart;  Patient's signature needed at discharge.  Patient to Follow up at:  Follow-up Information    Follow up with Bosie Clos, NP on 10/06/2011. (11:00AM appointment)    Contact information:   [facility] [address] [place], [place]  [address] Telephone:  [phone] Fax:  [phone]      Follow up with Joaquin Music on 10/09/2011. (9:00AM appointment for you with your husband)    Contact information:   [facility] [address] [place], [place]  [address] Telephone:  [phone] Fax:  [phone]      Follow up with [facility] on 10/08/2011. (at  11 am)    Contact information:   [facility] [phone] Your Record # [id number] Wednesday, March 27 @  11am         Patient denies SI/HI:   Yes,      Aeronautical engineer and Suicide Prevention discussed:  Yes,  During Aftercare Planning Group, Case Manager provided psychoeducation on "Suicide Prevention Information."  This included descriptions of risk factors for suicide, warning signs that an individual is in crisis and thinking of suicide, and what to do if this occurs.  Pt indicated understanding of information provided, and will read brochure given upon discharge.   Discussion was lively and patient participated actively.  Barrier to discharge identified:No.  Summary and Recommendations:  Follow up with med mgmt and counseling.   Sarina Ser 10/03/2011, 3:02 PM

## 2011-10-03 NOTE — Progress Notes (Signed)
Pt is bright and cheerful this evening  She attended and participated in karoke group this evening  She is making hopeful statements and reports looking forward to being discharged on Friday  Verbal support given  Medications administered and effectiveness monitored  Q 15 min checks  Pt safe at present
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
## 2011-10-06 NOTE — Discharge Summary (Signed)
Physician Discharge Summary Note  Patient:  Cassie Ruiz is an 46 y.o., female MRN:  [id number] DOB:  12-10-1965 Patient phone:  [phone] (home)  Patient address:   [address] [place] [address],   Date of Admission:  09/28/2011 Date of Discharge: 10/03/2011  Axis Diagnosis:  AXIS I:  Schizophrenia, paranoid type, acute exacerbation, resolved AXIS II:  Deferred AXIS III:  Breast Cancer Past Medical History  Diagnosis Date  . Hypothyroidism   . Mental disorder   . Depression    AXIS IV:  significan medical issues AXIS V:  61-70 mild symptoms  Level of Care:  OP  Hospital Course:  Cassie Ruiz is a 46 year old female with a history of depression and psychosis who presented to [facility] hospitals in [place] endorsing paranoid thinking and acquaintances were trying to kill her. She been previously hospitalized to [facility] in January 2013 and diagnosed at that time with schizophrenia, paranoid type. She was discharged on Risperdal but when she went home she stopped taking it out of concerns for dry mouth.  She presented with an exacerbation of her psychosis and thought delusional thinking that certain individuals were attempting to obtain her tramadol medication that she took for chronic back pain, and that they were taking with a chemical that caused would cause her to have necrotizing fasciitis.  Please see the admitting history for further details about her presentation. She was admitted to our acute stabilization unit where she initially presented with a guarded and vigilant affect. She was started back on trazodone for sleep and Risperdal to address her paranoia. She was cooperative with peers and staff in her stabilization was uneventful. Our counselor was in touch with her husband to validate accurate details about her family constellation and identify triggers which may have exacerbated her paranoia.  She did have concerns about taking the medication, but by the 22nd  she was willing to stay on the Risperdal, understood the side effects and how to cope with them and voiced her intention to remain on medications. She was returning home to her husband to pursue her treatment for her breast cancer.  Consults:  None  Significant Diagnostic Studies:  TSH is 0.920. Free T4-1 0.53. Urine pregnancy test negative. CBC and metabolic panel were unremarkable.  Discharge Vitals:   Blood pressure 113/72, pulse 109, temperature 97.1 F (36.2 C), temperature source Oral, resp. rate 18.  Mental Status Exam: See Mental Status Examination and Suicide Risk Assessment completed by Attending Physician prior to discharge.  Discharge destination:  Home  Is patient on multiple antipsychotic therapies at discharge:  No   Has Patient had three or more failed trials of antipsychotic monotherapy by history:  No  Recommended Plan for Multiple Antipsychotic Therapies: N/A   Medication List  As of 10/06/2011  3:09 PM   TAKE these medications      Indication    levothyroxine 75 MCG tablet   Commonly known as: SYNTHROID, LEVOTHROID   Take 1 tablet (75 mcg total) by mouth daily. Thyroid supplement.       risperiDONE 2 MG tablet   Commonly known as: RISPERDAL   Take 1 tablet (2 mg total) by mouth at bedtime. For psychosis.       traMADol 50 MG tablet   Commonly known as: ULTRAM   Take 2 tablets (100 mg total) by mouth 2 (two) times daily. For chronic back pain.       traZODone 100 MG tablet   Commonly known as:  DESYREL   Take 1 tablet (100 mg total) by mouth at bedtime. For sleep            Follow-up Information    Follow up with Bosie Clos, NP on 10/06/2011. (11:00AM appointment)    Contact information:   [facility] [address] [place], [place]  [address] Telephone:  [phone] Fax:  [phone]      Follow up with Joaquin Music on 10/09/2011. (9:00AM appointment for you with your husband)    Contact information:    [facility] [address] [place], [place]  [address] Telephone:  [phone] Fax:  [phone]      Follow up with [facility] on 10/08/2011. (at  11 am)    Contact information:   [facility] [phone] Your Record # [id number] Wednesday, March 27 @  11am         Follow-up recommendations:  Activity:  unrestricted Diet:  regular  Signed: Amma Ruiz A 10/06/2011, 3:09 PM

## 2011-10-08 NOTE — Progress Notes (Signed)
Patient Discharge Instructions:  Psychiatric Admission Assessment Note Provided,  10/07/2011 After Visit Summary (AVS) Provided,  10/07/2011 Face Sheet Provided, 10/07/2011 Faxed/Sent to the Next Level Care provider:  10/07/2011 Sent Suicide Risk Assessment - Discharge Assessment 10/07/2011  Faxed to [facility] @ [phone]  Wandra Scot, 10/08/2011, 2:30 PM

## 2011-11-24 ENCOUNTER — Encounter (HOSPITAL_COMMUNITY): Payer: Self-pay | Admitting: *Deleted

## 2011-11-24 ENCOUNTER — Emergency Department (HOSPITAL_COMMUNITY)
Admission: EM | Admit: 2011-11-24 | Discharge: 2011-11-25 | Disposition: A | Discharge status: Other Acute Inpatient Hospital | Payer: Managed Care, Other (non HMO) | Attending: Emergency Medicine | Admitting: Emergency Medicine

## 2011-11-24 DIAGNOSIS — E039 Hypothyroidism, unspecified: Secondary | ICD-10-CM | POA: Insufficient documentation

## 2011-11-24 DIAGNOSIS — R45851 Suicidal ideations: Secondary | ICD-10-CM | POA: Insufficient documentation

## 2011-11-24 DIAGNOSIS — F329 Major depressive disorder, single episode, unspecified: Secondary | ICD-10-CM

## 2011-11-24 DIAGNOSIS — F3289 Other specified depressive episodes: Secondary | ICD-10-CM | POA: Insufficient documentation

## 2011-11-24 DIAGNOSIS — F200 Paranoid schizophrenia: Secondary | ICD-10-CM | POA: Insufficient documentation

## 2011-11-24 HISTORY — DX: Malignant neoplasm of unspecified site of unspecified female breast: C50.919

## 2011-11-24 LAB — CBC
HCT: 40.2 % (ref 36.0–46.0)
Hemoglobin: 13.8 g/dL (ref 12.0–15.0)
RBC: 4.61 MIL/uL (ref 3.87–5.11)
WBC: 9.9 10*3/uL (ref 4.0–10.5)

## 2011-11-24 LAB — DIFFERENTIAL
Eosinophils Relative: 0 % (ref 0–5)
Lymphocytes Relative: 15 % (ref 12–46)
Lymphs Abs: 1.5 10*3/uL (ref 0.7–4.0)
Monocytes Absolute: 0.9 10*3/uL (ref 0.1–1.0)
Monocytes Relative: 9 % (ref 3–12)
Neutro Abs: 7.4 10*3/uL (ref 1.7–7.7)

## 2011-11-24 LAB — POCT PREGNANCY, URINE: Preg Test, Ur: NEGATIVE

## 2011-11-24 MED ORDER — TRAZODONE HCL 50 MG PO TABS
50.0000 mg | ORAL_TABLET | Freq: Every day | ORAL | Status: DC
  Administered 2011-11-25: 50 mg via ORAL
  Filled 2011-11-24: qty 1

## 2011-11-24 MED ORDER — IBUPROFEN 200 MG PO TABS
600.0000 mg | ORAL_TABLET | Freq: Three times a day (TID) | ORAL | Status: DC | PRN
  Administered 2011-11-25: 600 mg via ORAL
  Filled 2011-11-24: qty 3

## 2011-11-24 MED ORDER — ZOLPIDEM TARTRATE 5 MG PO TABS: 5.0000 mg | ORAL_TABLET | Freq: Every evening | ORAL | Status: DC | PRN

## 2011-11-24 MED ORDER — RISPERIDONE 2 MG PO TABS
4.0000 mg | ORAL_TABLET | Freq: Every day | ORAL | Status: DC
Start: 2011-11-24 — End: 2011-11-25
  Administered 2011-11-25: 4 mg via ORAL
  Filled 2011-11-24 (×2): qty 2

## 2011-11-24 MED ORDER — LORAZEPAM 1 MG PO TABS: 1.0000 mg | ORAL_TABLET | Freq: Three times a day (TID) | ORAL | Status: DC | PRN

## 2011-11-24 MED ORDER — ALUM & MAG HYDROXIDE-SIMETH 200-200-20 MG/5ML PO SUSP: 30.0000 mL | ORAL | Status: DC | PRN

## 2011-11-24 MED ORDER — LEVOTHYROXINE SODIUM 75 MCG PO TABS
75.0000 ug | ORAL_TABLET | Freq: Every day | ORAL | Status: DC
  Administered 2011-11-25: 75 ug via ORAL
  Filled 2011-11-24 (×2): qty 1

## 2011-11-24 MED ORDER — ACETAMINOPHEN 325 MG PO TABS: 650.0000 mg | ORAL_TABLET | ORAL | Status: DC | PRN

## 2011-11-24 NOTE — ED Notes (Addendum)
C/o "fearful of life in danger", mentions "suicidal", "cannot live in this stressful situation". H/o same, h/o paranoid schizophrenia. "thinks she needs [facility]", was there ~ 2 months ago. Denies a/v hallucinations or substance abuse, but pt refers to a vague "they want me to", "they tell me to"... "OD or hurt/kill myself". Wait, process & plan explained.

## 2011-11-24 NOTE — ED Notes (Signed)
Mental health at bedside.

## 2011-11-24 NOTE — ED Provider Notes (Signed)
History     CSN: [id number]  Arrival date & time 11/24/11  2103   First MD Initiated Contact with Patient 11/24/11 2247      Chief Complaint  Patient presents with  . Suicidal  . Paranoid    (Consider location/radiation/quality/duration/timing/severity/associated sxs/prior treatment) HPI Patient presents to the emergency room with complaints of suicidal ideations. The patient states they she stopped taking her medications about a week and a half ago because she did not like the way they made her feel. Following this, she began having increasing depression and she felt like someone was out to get her. Patient thinks her husband and her husband's coworker want her to kill herself. The patient states she does your voices that are telling her to harm herself. Nothing seems to be helping. Patient states she is very stressed and she can't live like this. She previously was admitted to the [facility] and she feels like she needs to be there again. Past Medical History  Diagnosis Date  . Hypothyroidism   . Mental disorder   . Depression   . Breast cancer     Left    Past Surgical History  Procedure Date  . Breast surgery   . Tonsillectomy     No family history on file.  History  Substance Use Topics  . Smoking status: Never Smoker   . Smokeless tobacco: Not on file  . Alcohol Use: No    OB History    Grav Para Term Preterm Abortions TAB SAB Ect Mult Living                  Review of Systems  All other systems reviewed and are negative.    Allergies  Codeine  Home Medications   Current Outpatient Rx  Name Route Sig Dispense Refill  . LEVOTHYROXINE SODIUM 75 MCG PO TABS Oral Take 1 tablet (75 mcg total) by mouth daily. Thyroid supplement.    Marland Kitchen RISPERIDONE 4 MG PO TABS Oral Take 4 mg by mouth at bedtime.    . TRAMADOL HCL ER 200 MG PO TB24 Oral Take 200 mg by mouth 2 (two) times daily.    . TRAZODONE HCL PO Oral Take 1 tablet by mouth at bedtime.    Marland Kitchen  RISPERIDONE 2 MG PO TABS Oral Take 1 tablet (2 mg total) by mouth at bedtime. For psychosis. 30 tablet 0  . TRAZODONE HCL 100 MG PO TABS Oral Take 1 tablet (100 mg total) by mouth at bedtime. For sleep 30 tablet 0    BP 118/53  Pulse 76  Temp(Src) 98.9 F (37.2 C) (Oral)  Resp 18  Wt 185 lb (83.915 kg)  SpO2 98%  LMP 09/24/2011  Physical Exam  Nursing note and vitals reviewed. Constitutional: She appears well-developed and well-nourished. No distress.  HENT:  Head: Normocephalic and atraumatic.  Right Ear: External ear normal.  Left Ear: External ear normal.  Eyes: Conjunctivae are normal. Right eye exhibits no discharge. Left eye exhibits no discharge. No scleral icterus.  Neck: Neck supple. No tracheal deviation present.  Cardiovascular: Normal rate, regular rhythm and intact distal pulses.   Pulmonary/Chest: Effort normal and breath sounds normal. No stridor. No respiratory distress. She has no wheezes. She has no rales.  Abdominal: Soft. Bowel sounds are normal. She exhibits no distension. There is no tenderness. There is no rebound and no guarding.  Musculoskeletal: She exhibits no edema and no tenderness.  Neurological: She is alert. She has normal strength.  No sensory deficit. Cranial nerve deficit:  no gross defecits noted. She exhibits normal muscle tone. She displays no seizure activity. Coordination normal.  Skin: Skin is warm and dry. No rash noted.  Psychiatric: Her mood appears anxious. Her speech is delayed. Her speech is not rapid and/or pressured. She is withdrawn and actively hallucinating. Thought content is paranoid. She exhibits a depressed mood. She expresses suicidal ideation.    ED Course  Procedures (including critical care time)   Labs Reviewed  CBC  DIFFERENTIAL  POCT PREGNANCY, URINE  BASIC METABOLIC PANEL  ETHANOL  URINE RAPID DRUG SCREEN (HOSP PERFORMED)      MDM  The patient appears to have paranoid delusions. I will consult the act team  to assist in getting her placement at a psychiatric facility. They placed orders for the patient to start taking her psychiatric medications in the emergency department.       Celene Kras, MD 11/24/11 [phone]

## 2011-11-25 ENCOUNTER — Inpatient Hospital Stay (HOSPITAL_COMMUNITY)
Admission: AD | Admit: 2011-11-25 | Discharge: 2011-12-09 | DRG: 885 | Disposition: A | Discharge status: Home / Self Care | Payer: Managed Care, Other (non HMO) | Source: Ambulatory Visit | Attending: Psychiatry | Admitting: Psychiatry

## 2011-11-25 DIAGNOSIS — E039 Hypothyroidism, unspecified: Secondary | ICD-10-CM | POA: Diagnosis present

## 2011-11-25 DIAGNOSIS — Z79899 Other long term (current) drug therapy: Secondary | ICD-10-CM

## 2011-11-25 DIAGNOSIS — Z853 Personal history of malignant neoplasm of breast: Secondary | ICD-10-CM

## 2011-11-25 DIAGNOSIS — F200 Paranoid schizophrenia: Principal | ICD-10-CM | POA: Diagnosis present

## 2011-11-25 DIAGNOSIS — F411 Generalized anxiety disorder: Secondary | ICD-10-CM | POA: Diagnosis present

## 2011-11-25 HISTORY — DX: Anxiety disorder, unspecified: F41.9

## 2011-11-25 LAB — BASIC METABOLIC PANEL
BUN: 10 mg/dL (ref 6–23)
Chloride: 102 mEq/L (ref 96–112)
GFR calc Af Amer: >90 mL/min (ref >90)
GFR calc non Af Amer: >90 mL/min (ref >90)
Potassium: 3.5 mEq/L (ref 3.5–5.1)
Sodium: 139 mEq/L (ref 135–145)

## 2011-11-25 LAB — RAPID URINE DRUG SCREEN, HOSP PERFORMED
Amphetamines: NOT DETECTED
Barbiturates: NOT DETECTED
Benzodiazepines: NOT DETECTED
Cocaine: NOT DETECTED
Tetrahydrocannabinol: NOT DETECTED

## 2011-11-25 MED ORDER — LEVOTHYROXINE SODIUM 75 MCG PO TABS
75.0000 ug | ORAL_TABLET | Freq: Every day | ORAL | Status: DC
  Filled 2011-11-25: qty 1

## 2011-11-25 MED ORDER — TRAMADOL HCL 50 MG PO TABS
50.0000 mg | ORAL_TABLET | Freq: Two times a day (BID) | ORAL | Status: DC
  Administered 2011-11-25: 50 mg via ORAL
  Filled 2011-11-25: qty 1

## 2011-11-25 MED ORDER — TRAZODONE HCL 50 MG PO TABS
50.0000 mg | ORAL_TABLET | Freq: Every evening | ORAL | Status: DC | PRN
  Filled 2011-11-25: qty 1
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
## 2011-11-25 NOTE — ED Notes (Signed)
Security called to transfer patient.

## 2011-11-25 NOTE — ED Notes (Signed)
Patient taken by security and sitter for a shower.

## 2011-11-25 NOTE — ED Notes (Signed)
Called and gave report to Jane. 

## 2011-11-25 NOTE — ED Notes (Signed)
Belongings checked, verified by 2nd RN and locked in behavioral health cabinet.

## 2011-11-25 NOTE — ED Notes (Signed)
Pt took meds and stated she wanted to sleep longer, that she had problems falling asleep last night. Pt denies any AH or VH at this time.

## 2011-11-25 NOTE — ED Notes (Signed)
Pt ambulating in hallway with sitter. Pt stated she needed to get up and walk around.

## 2011-11-26 ENCOUNTER — Encounter (HOSPITAL_COMMUNITY): Payer: Self-pay | Admitting: *Deleted

## 2011-11-26 DIAGNOSIS — F200 Paranoid schizophrenia: Principal | ICD-10-CM

## 2011-11-26 MED ORDER — SERTRALINE HCL 50 MG PO TABS
50.0000 mg | ORAL_TABLET | Freq: Every day | ORAL | Status: DC
  Administered 2011-11-27 – 2011-11-28 (×2): 50 mg via ORAL
  Filled 2011-11-26 (×6): qty 1

## 2011-11-26 MED ORDER — TRAZODONE HCL 100 MG PO TABS
100.0000 mg | ORAL_TABLET | Freq: Every day | ORAL | Status: DC
  Administered 2011-11-26: 100 mg via ORAL
  Filled 2011-11-26 (×2): qty 1

## 2011-11-26 MED ORDER — LEVOTHYROXINE SODIUM 75 MCG PO TABS
75.0000 ug | ORAL_TABLET | Freq: Every day | ORAL | Status: DC
  Administered 2011-11-26 – 2011-12-09 (×13): 75 ug via ORAL
  Filled 2011-11-26 (×7): qty 1
  Filled 2011-11-26: qty 3
  Filled 2011-11-26 (×9): qty 1

## 2011-11-26 MED ORDER — TRAZODONE HCL 50 MG PO TABS
50.0000 mg | ORAL_TABLET | Freq: Every day | ORAL | Status: DC
  Filled 2011-11-26 (×2): qty 1

## 2011-11-26 MED ORDER — ARIPIPRAZOLE 5 MG PO TABS
5.0000 mg | ORAL_TABLET | Freq: Every day | ORAL | Status: DC
  Filled 2011-11-26: qty 1

## 2011-11-26 MED ORDER — ARIPIPRAZOLE 5 MG PO TABS
5.0000 mg | ORAL_TABLET | Freq: Every day | ORAL | Status: DC
  Administered 2011-11-26: 5 mg via ORAL
  Filled 2011-11-26 (×4): qty 1

## 2011-11-26 MED ORDER — ALUM & MAG HYDROXIDE-SIMETH 200-200-20 MG/5ML PO SUSP: 30.0000 mL | ORAL | Status: DC | PRN

## 2011-11-26 MED ORDER — ACETAMINOPHEN 325 MG PO TABS
650.0000 mg | ORAL_TABLET | Freq: Four times a day (QID) | ORAL | Status: DC | PRN
  Administered 2011-11-26 – 2011-12-07 (×14): 650 mg via ORAL

## 2011-11-26 MED ORDER — MAGNESIUM HYDROXIDE 400 MG/5ML PO SUSP: 30.0000 mL | Freq: Every day | ORAL | Status: DC | PRN

## 2011-11-26 MED ORDER — TRAMADOL HCL 50 MG PO TABS
100.0000 mg | ORAL_TABLET | Freq: Two times a day (BID) | ORAL | Status: DC | PRN
  Administered 2011-11-26 – 2011-11-28 (×4): 100 mg via ORAL
  Filled 2011-11-26 (×4): qty 2

## 2011-11-26 NOTE — Progress Notes (Signed)
45yo female who presents voluntarily and in no acute distress for the treatment of Depression, SI, Anxiety and Thought D/O. Appears blunted and depressed. Somewhat anxious and guarded, but cooperative during assessment. States she believes that there is a woman and a man that are out to get her. States she feels that the woman wants her dead so that she can have her husband. Believes that her life is in danger and they have threatened to kill her and make it look like a suicide. Has not been taking her anti-psychotic for 2 weeks as she feels like it makes her too drowsy and "swimmy headed". Has been a pt a [facility] numerous times in the past. Currently denies SI/HI/AVH and contracts for safety. Denies Drug and/or ETOH abuse. Denies pain. See assessment for greater medical background. POC, Unit policies and expectations reviewed and understanding verbalized. Consents obtained. Belongings searched by Camelia Eng, MHT and some items placed in locker #42. Skin assessed by Marcelino Duster, RN and found to be clear apart from old surgical wound. Food and fluids offered and refused. Escorted to the unit and oriented by Terri, MHT. No questions or concerns expressed.  Emergency Contact: Stephine Langbehn (spouse) cell# [phone]

## 2011-11-26 NOTE — Progress Notes (Signed)
Mood and affect flat and depressed. She denied SI/HI and denied hallucinations. She isolates to her room and had minimal interactions with peers and staff. Patient complaint of head head and back ache. She received tramadol for the back ache and later Tylenol for head ache. Q 15 minute check continues to maintain safety.

## 2011-11-26 NOTE — Progress Notes (Signed)
11/26/2011         Time: 0930      Group Topic/Focus: The focus of this group is on discussing various styles of communication and communicating assertively using 'I' (feeling) statements.  Participation Level: Active  Participation Quality: Attentive  Affect: Blunted  Cognitive: Alert  Additional Comments: None.   Areesha Dehaven 11/26/2011 11:24 AM 
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
## 2011-11-26 NOTE — Discharge Planning (Signed)
Met with patient in Aftercare Planning Group.   Affect blunted and mood depressed.  She reported that she does still live with her husband, but is convinced that he is out to get her as are other people.  She said that her goal for this hospitalization is to "feel safe" and that she does feel safe here in the hospital.  Patient reported she is seen for medication management and therapy at [facility] in [place], [place].  She is hearing voices and is severely paranoid right now.  No case management needs today.  Ambrose Mantle, LCSW 11/26/2011, 11:48 AM

## 2011-11-26 NOTE — Progress Notes (Signed)
Adult Psychosocial Assessment Update Interdisciplinary Team  Previous [facility] admissions/discharges:  Admissions Discharges  Date:3/17/ Date:  Date: Date:  Date: Date:  Date: Date:  Date: Date:   Changes since the last Psychosocial Assessment (including adherence to outpatient mental health and/or substance abuse treatment, situational issues contributing to decompensation and/or relapse). Depression, anxiety Patient thinks that man and woman are trying to get rid of her so that they can take her husband away from her.  Patient stopped taking her medication because of the way it made her feel.           Discharge Plan 1. Will you be returning to the same living situation after discharge?   Yes:x No:      If no, what is your plan?    Lives with husband       2. Would you like a referral for services when you are discharged? Yes:     If yes, for what services?  No:  x     Return to [facility] in [place]       Summary and Recommendations (to be completed by the evaluator) Patient is a  46 year old female with diagnosis of Schizophrenia, paranoid type. She was admitted with depression, anxiety and thought disorder. She is guarded and anxious. She is delusional about a man and woman wanting her dead so they can take her husband. This was the same delusion that patient had on previous admission. She had stopped taking her medications for two weeks and became paranoid and delusional. Patient will benefit from crisis stabilization, medication evaluation, group therapy and psycho-educational groups to work on coping skills, case management for referrals and counselor to contact family.                       Signature:  Veto Kemps, 11/26/2011 9:09 AM

## 2011-11-26 NOTE — Progress Notes (Signed)
Pt states he slept poor, appetite improving, energy level is high. Pt rates depression as a 4, denies SI/HI. Pt is very personable with staff, but admits he has to work on his anxiety. Pt's family is to bring in Enbrel shots for pt's psoriasis this evening.

## 2011-11-26 NOTE — Tx Team (Signed)
Initial Interdisciplinary Treatment Plan  PATIENT STRENGTHS: (choose at least two) Ability for insight Capable of independent living Communication skills General fund of knowledge Motivation for treatment/growth Religious Affiliation Supportive family/friends  PATIENT STRESSORS: Health problems Medication change or noncompliance   PROBLEM LIST: Problem List/Patient Goals Date to be addressed Date deferred Reason deferred Estimated date of resolution  Depression      Thought D/O      Anxiety      SI                                     DISCHARGE CRITERIA:  Ability to meet basic life and health needs Adequate post-discharge living arrangements Improved stabilization in mood, thinking, and/or behavior Medical problems require only outpatient monitoring Motivation to continue treatment in a less acute level of care Need for constant or close observation no longer present Reduction of life-threatening or endangering symptoms to within safe limits Safe-care adequate arrangements made Verbal commitment to aftercare and medication compliance  PRELIMINARY DISCHARGE PLAN: Outpatient therapy Return to previous living arrangement  PATIENT/FAMIILY INVOLVEMENT: This treatment plan has been presented to and reviewed with the patient, Cassie Ruiz.  The patient has been given the opportunity to ask questions and make suggestions.  Arturo Morton 11/26/2011, 1:29 AM

## 2011-11-26 NOTE — H&P (Signed)
Psychiatric Admission Assessment Adult  Patient Identification:  Cassie Ruiz Date of Evaluation:  11/26/2011 Chief Complaint:  MDD, Recurrent History of Present Illness: The patient presented voluntarily to the [facility] ED where she reported increasing feelings of depression and suicidal ideation.  She notes that she felt that her husband and her husband's coworker wanted her to kill herself. Cassie Ruiz reports poor sleep, decreased appetite, rates her depression at a 7, her anxiety at an 8-9, and reports auditory hallucinations telling her to kill herself and "make it look like a suicide." She reports none today.  Reports her hopelessness at an 8-9.      She has had this problem in the past and was placed on Risperidal which did help, but she quit taking it a week ago due to nasal congestion and stuffiness.  Her symptoms began shortly after she stopped her medication.  She was recently in [facility] in March of this year for the same problem.  Past Psychiatric History: Previous admission in 09/2011 Diagnosis: Schizophrenia paranoid type  Hospitalizations:  Outpatient Care:  Substance Abuse Care:  Self-Mutilation:  Suicidal Attempts:  Violent Behaviors:   Past Medical History:   Past Medical History  Diagnosis Date  . Hypothyroidism   . Breast cancer     Left  . Anxiety     Allergies:   Allergies  Allergen Reactions  . Codeine Hives   PTA Medications: Prescriptions prior to admission  Medication Sig Dispense Refill  . levothyroxine (SYNTHROID, LEVOTHROID) 75 MCG tablet Take 1 tablet (75 mcg total) by mouth daily. Thyroid supplement.      . risperidone (RISPERDAL) 4 MG tablet Take 4 mg by mouth at bedtime.      . traMADol (ULTRAM-ER) 200 MG 24 hr tablet Take 200 mg by mouth 2 (two) times daily.      . TRAZODONE HCL PO Take 100 tablets by mouth at bedtime.       . risperiDONE (RISPERDAL) 2 MG tablet Take 1 tablet (2 mg total) by mouth at bedtime. For psychosis.  30 tablet  0  . traZODone (DESYREL)  100 MG tablet Take 1 tablet (100 mg total) by mouth at bedtime. For sleep  30 tablet  0    Previous Psychotropic Medications: Risperdal  Substance abuse: None   Consequences of Substance Abuse: None   Social History: Current Place of Residence:  [employer] of Birth:   Family Members: Marital Status:  Married Children:  Sons:  Daughters: Relationships: Education:   Educational Problems/Performance: Religious Beliefs/Practices: History of Abuse (Emotional/Phsycial/Sexual) Occupational Experiences; Military History:  None. Legal History: Hobbies/Interests:  Family History:   Family History  Problem Relation Age of Onset  . Anesthesia problems Neg Hx   . Malignant hyperthermia Neg Hx   . Pseudochol deficiency Neg Hx   . Hypotension Neg Hx    ROS: Negative with the exception of the HPI. PE: Completed by MD in the ED.  I have evaluated the patient and reviewed the chart and agree with those findings.  Mental Status Examination/Evaluation: Objective:  Appearance: Casual  Eye Contact::  Fair  Speech:  monotone with poverty of speech  Volume:  Normal  Mood:  Depressed  Affect:  Blunt  Thought Process:  Coherent  Orientation:  Full  Thought Content:  Delusions and Paranoid Ideation  Suicidal Thoughts:  No  Homicidal Thoughts:  No  Memory:  Immediate;   Fair  Judgement:  Fair  Insight:  Lacking  Psychomotor Activity:  Decreased  Concentration:  Fair  Recall:  Fair  Akathisia:  No  Handed:  Right  AIMS (if indicated):     Assets:  Communication Skills Desire for Improvement Housing Social Support  Sleep:  Number of Hours: 2.75  (new admit on unit @0145 )    Laboratory/X-Ray Psychological Evaluation(s)  Results for JEIRY, BIRNBAUM (MRN [id number]) as of 11/26/2011 11:08  Ref. Range 11/24/2011 22:50  Sodium Latest Range: 135-145 mEq/L 139  Potassium Latest Range: 3.5-5.1 mEq/L 3.5  Chloride Latest Range: 96-112 mEq/L 102  CO2 Latest Range: 19-32 mEq/L 24  BUN  Latest Range: 6-23 mg/dL 10  Creat Latest Range: 0.50-1.10 mg/dL 2.95  Calcium Latest Range: 8.4-10.5 mg/dL 9.7  GFR calc non Af Amer Latest Range: >90 mL/min >90  GFR calc Af Amer Latest Range: >90 mL/min >90  Glucose Latest Range: 70-99 mg/dL 99  WBC Latest Range: 2.8-41.3 K/uL 9.9  RBC Latest Range: 3.87-5.11 MIL/uL 4.61  Hemoglobin Latest Range: 12.0-15.0 g/dL 24.4  HCT Latest Range: 36.0-46.0 % 40.2  MCV Latest Range: 78.0-100.0 fL 87.2  MCH Latest Range: 26.0-34.0 pg 29.9  MCHC Latest Range: 30.0-36.0 g/dL 01.0  RDW Latest Range: 11.5-15.5 % 13.8  Platelets Latest Range: 150-400 K/uL 293  Neutrophils Relative Latest Range: 43-77 % 75  Lymphocytes Relative Latest Range: 12-46 % 15  Monocytes Relative Latest Range: 3-12 % 9  Eosinophils Relative Latest Range: 0-5 % 0  Basophils Relative Latest Range: 0-1 % 0  NEUT# Latest Range: 1.7-7.7 K/uL 7.4  Lymphocytes Absolute Latest Range: 0.7-4.0 K/uL 1.5  Monocytes Absolute Latest Range: 0.1-1.0 K/uL 0.9  Eosinophils Absolute Latest Range: 0.0-0.7 K/uL 0.0  Basophils Absolute Latest Range: 0.0-0.1 K/uL 0.0  Alcohol, Ethyl (B) Latest Range: 0-11 mg/dL <27      Assessment:   AXIS I: Schizophrenia - Paranoid Type.  AXIS II: None Noted.  AXIS III: 1. Hypothyroidism.  AXIS IV: Chronic Mental Illness  AXIS V: GAF at admission approximately 30. GAF at Discharge approximately 65  Treatment Plan Summary:  1. Daily contact with patient to assess and evaluate symptoms and progress in treatment  2. Medication management  3. The patient will deny suicidal ideations or homicidal ideations for 48 hours prior to discharge and have a depression and anxiety rating of 3 or less. The patient will also deny any auditory or visual hallucinations or delusional thinking.  Plan:  1. Will start the patient on Abilify  5mg  for her psychosis .as she wants to try this medication. 2. Will continue any appropriate home medications. 3. Will continue to  monitor.  4. Laboratory studies reviewed.    Current Medications:  Current Facility-Administered Medications  Medication Dose Route Frequency Provider Last Rate Last Dose  . acetaminophen (TYLENOL) tablet 650 mg  650 mg Oral Q6H PRN Verne Spurr, PA-C      . alum & mag hydroxide-simeth (MAALOX/MYLANTA) 200-200-20 MG/5ML suspension 30 mL  30 mL Oral Q4H PRN Verne Spurr, PA-C      . magnesium hydroxide (MILK OF MAGNESIA) suspension 30 mL  30 mL Oral Daily PRN Verne Spurr, PA-C       Facility-Administered Medications Ordered in Other Encounters  Medication Dose Route Frequency Provider Last Rate Last Dose  . DISCONTD: acetaminophen (TYLENOL) tablet 650 mg  650 mg Oral Q4H PRN Celene Kras, MD      . DISCONTD: alum & mag hydroxide-simeth (MAALOX/MYLANTA) 200-200-20 MG/5ML suspension 30 mL  30 mL Oral PRN Celene Kras, MD      . DISCONTD: ibuprofen (ADVIL,MOTRIN)  tablet 600 mg  600 mg Oral Q8H PRN Celene Kras, MD   600 mg at 11/25/11 2000  . DISCONTD: levothyroxine (SYNTHROID, LEVOTHROID) tablet 75 mcg  75 mcg Oral Q0600 Celene Kras, MD   75 mcg at 11/25/11 0743  . DISCONTD: levothyroxine (SYNTHROID, LEVOTHROID) tablet 75 mcg  75 mcg Oral Q0600 Verne Spurr, PA-C      . DISCONTD: LORazepam (ATIVAN) tablet 1 mg  1 mg Oral Q8H PRN Celene Kras, MD      . DISCONTD: risperiDONE (RISPERDAL) tablet 4 mg  4 mg Oral QHS Celene Kras, MD   4 mg at 11/25/11 0026  . DISCONTD: traMADol (ULTRAM) tablet 50 mg  50 mg Oral Q12H Cheri Guppy, MD   50 mg at 11/25/11 2224  . DISCONTD: traZODone (DESYREL) tablet 50 mg  50 mg Oral QHS Celene Kras, MD   50 mg at 11/25/11 0027  . DISCONTD: traZODone (DESYREL) tablet 50 mg  50 mg Oral QHS PRN Verne Spurr, PA-C      . DISCONTD: zolpidem (AMBIEN) tablet 5 mg  5 mg Oral QHS PRN Celene Kras, MD        Observation Level/Precautions:  Routine  Laboratory:    Psychotherapy:    Medications:    Routine PRN Medications:  Yes  Consultations:    Discharge Concerns:     Other:      Lloyd Huger T. Court Gracia PAC For Dr. Harvie Heck Readling 5/15/201310:14 AM

## 2011-11-27 LAB — T3, FREE: T3, Free: 3.1 pg/mL (ref 2.3–4.2)

## 2011-11-27 LAB — T4, FREE: Free T4: 1.6 ng/dL (ref 0.80–1.80)

## 2011-11-27 MED ORDER — ARIPIPRAZOLE 10 MG PO TABS
10.0000 mg | ORAL_TABLET | Freq: Every day | ORAL | Status: DC
  Administered 2011-11-27 – 2011-11-28 (×2): 10 mg via ORAL
  Filled 2011-11-27 (×4): qty 1

## 2011-11-27 MED ORDER — ALPRAZOLAM 0.5 MG PO TABS
0.5000 mg | ORAL_TABLET | Freq: Three times a day (TID) | ORAL | Status: DC | PRN
  Administered 2011-11-27 – 2011-12-08 (×11): 0.5 mg via ORAL
  Filled 2011-11-27 (×12): qty 1

## 2011-11-27 MED ORDER — DIPHENHYDRAMINE HCL 50 MG PO CAPS
50.0000 mg | ORAL_CAPSULE | Freq: Once | ORAL | Status: AC
  Administered 2011-11-27: 50 mg via ORAL
  Filled 2011-11-27: qty 1

## 2011-11-27 MED ORDER — DIPHENHYDRAMINE HCL 50 MG PO CAPS
50.0000 mg | ORAL_CAPSULE | Freq: Every day | ORAL | Status: DC
  Administered 2011-11-27 – 2011-12-08 (×12): 50 mg via ORAL
  Filled 2011-11-27 (×3): qty 1
  Filled 2011-11-27: qty 3
  Filled 2011-11-27 (×10): qty 1

## 2011-11-27 MED ORDER — IBUPROFEN 800 MG PO TABS
800.0000 mg | ORAL_TABLET | Freq: Four times a day (QID) | ORAL | Status: DC | PRN
  Administered 2011-11-27 – 2011-11-28 (×2): 800 mg via ORAL
  Filled 2011-11-27 (×3): qty 1

## 2011-11-27 NOTE — Progress Notes (Signed)
Patien's mood and affect improved from what it was yesterday. She is interacting more with peers and staff and opened up more. She requested for Ibuprofen 800 mg at the beginning of the shift for back pain. She denied SI/HI and said the voices are less than yesterday. Patient received her pain medication as ordered. Q 15 minute check continues to maintain safety.

## 2011-11-27 NOTE — Progress Notes (Signed)
[facility] MD Progress Note  11/27/2011 2:59 PM  Diagnosis:  Axis I: Schizophrenia - Paranoid Type.   The patient was seen today and reports the following:   ADL's: Intact.  Sleep: The patient reports to having some difficulty initiating and maintaining sleep last night but states she slept well after she was given benadryl.  Appetite: The patient reports a decreased appetite today.   Mild>(1-10) >Severe  Hopelessness (1-10): 5  Depression (1-10): 8  Anxiety (1-10): 5   Suicidal Ideation: The patient denies any suicidal ideations today.  Plan: No  Intent: No  Means: No  Homicidal Ideation: The patient adamantly denies any homicidal ideations today.  Plan: No  Intent: No.  Means: No   General Appearance/Behavior: The patient was again cooperative today but paranoid in her thinking.  Eye Contact: Good.  Speech: Appropriate in rate and volume with no pressuring noted today.  Motor Behavior: wnl.  Level of Consciousness: Alert and Oriented x 3.  Mental Status: Alert and Oriented x 3.  Mood: Moderate to severely depressed.  Affect: Moderately constricted today.  Anxiety Level: Moderate anxiety reported today.  Thought Process: Paranoid thinking as well as auditory hallucinations remains.  Thought Content: The patient reports auditory hallucinations continuing as well as paranoid delusions that someone is trying to kill her in order to take her husband.  Perception: Paranoid thinking as well as auditory hallucinations remains.  Judgment: Poor.  Insight: Poor.  Cognition: Oriented to person, place and time.  Sleep:  Number of Hours: 4.75    Vital Signs:Blood pressure 110/72, pulse 90, temperature 98.2 F (36.8 C), temperature source Oral, resp. rate 18, height 5' 3.39" (1.61 m), weight 84.369 kg (186 lb), last menstrual period 09/24/2011.  Current Medications: Current Facility-Administered Medications  Medication Dose Route Frequency Provider Last Rate Last Dose  . acetaminophen  (TYLENOL) tablet 650 mg  650 mg Oral Q6H PRN Verne Spurr, PA-C   650 mg at 11/27/11 1205  . ALPRAZolam (XANAX) tablet 0.5 mg  0.5 mg Oral TID PRN Ronny Bacon, MD      . alum & mag hydroxide-simeth (MAALOX/MYLANTA) 200-200-20 MG/5ML suspension 30 mL  30 mL Oral Q4H PRN Verne Spurr, PA-C      . ARIPiprazole (ABILIFY) tablet 10 mg  10 mg Oral QHS Curlene Labrum Fayetta Sorenson, MD      . diphenhydrAMINE (BENADRYL) capsule 50 mg  50 mg Oral Once Jorje Guild, PA-C   50 mg at 11/27/11 0128  . diphenhydrAMINE (BENADRYL) capsule 50 mg  50 mg Oral Q2200 Curlene Labrum Henery Betzold, MD      . levothyroxine (SYNTHROID, LEVOTHROID) tablet 75 mcg  75 mcg Oral Daily Verne Spurr, PA-C   75 mcg at 11/27/11 0836  . magnesium hydroxide (MILK OF MAGNESIA) suspension 30 mL  30 mL Oral Daily PRN Verne Spurr, PA-C      . sertraline (ZOLOFT) tablet 50 mg  50 mg Oral Daily Curlene Labrum Katilin Raynes, MD   50 mg at 11/27/11 0837  . traMADol (ULTRAM) tablet 100 mg  100 mg Oral Q12H PRN Curlene Labrum Layla Gramm, MD   100 mg at 11/27/11 0837  . DISCONTD: ARIPiprazole (ABILIFY) tablet 5 mg  5 mg Oral Daily Verne Spurr, PA-C   5 mg at 11/26/11 1201  . DISCONTD: ARIPiprazole (ABILIFY) tablet 5 mg  5 mg Oral QHS Curlene Labrum Blasa Raisch, MD      . DISCONTD: traZODone (DESYREL) tablet 100 mg  100 mg Oral QHS Ronny Bacon, MD   100  mg at 11/26/11 2136  . DISCONTD: traZODone (DESYREL) tablet 50 mg  50 mg Oral QHS Verne Spurr, PA-C       Lab Results:  Results for orders placed during the hospital encounter of 11/25/11 (from the past 48 hour(s))  TSH     Status: Abnormal   Collection Time   11/26/11  7:37 PM      Component Value Range Comment   TSH 0.229 (*) 0.350 - 4.500 (uIU/mL)   T3, FREE     Status: Normal   Collection Time   11/26/11  7:37 PM      Component Value Range Comment   T3, Free 3.1  2.3 - 4.2 (pg/mL)   T4, FREE     Status: Normal   Collection Time   11/26/11  7:37 PM      Component Value Range Comment   Free T4 1.60  0.80 - 1.80 (ng/dL)     Time was spent today discussing with the patient her current symptoms. The patient states that she had difficulty initiating and maintaining sleep last night until she was given 50 mgs of Benadryl.  Afterwards she states she slept well.  The patient reports a decreased appetite as well as severe feelings of sadness, anhedonia and depressed mood.  She reports moderate anxiety and continues to report paranoid delusions and auditory hallucinations.  She denies any suicidal or homicidal ideations today.  The patient requested prn Xanax for anxiety and this will be ordered today.  Treatment Plan Summary:  1. Daily contact with patient to assess and evaluate symptoms and progress in treatment  2. Medication management  3. The patient will deny suicidal ideations or homicidal ideations for 48 hours prior to discharge and have a depression and anxiety rating of 3 or less. The patient will also deny any auditory or visual hallucinations or delusional thinking.  4. The patient will deny any symptoms of substance withdrawal at time of discharge.  Plan:  1. Will continue the medication Zoloft at 50 mgs po q am for depression and anxiety.  2. Will continue the medication Tramadol 100 mgs po BID - prn for chronic back pain.  3. Will continue the medication Synthroid at 75 mcgs po q am for hypothyroidism.  4, Will increase the medication Abilify to 10 mgs po qhs for delusions and hallucinations.  5. Will discontinue the Trazodone and start Benadryl 50 mgs po qhs for sleep. 6. Will start Xanax 0.5 mgs po TID - prn for anxiety. 7. Will continue to monitor.  8. Laboratory studies reviewed.  9. Will continue to monitor.  Cassie Ruiz 11/27/2011, 2:59 PM

## 2011-11-27 NOTE — Progress Notes (Signed)
[facility] Group Notes:  (Counselor/Nursing/MHT/Case Management/Adjunct)  11/27/2011 1:46 PM  Type of Therapy:  Group Therapy  Participation Level:  Minimal  Participation Quality:  Attentive  Affect:  Depressed  Cognitive:  Oriented  Insight:  Limited  Engagement in Group:  Limited  Engagement in Therapy:  Limited  Modes of Intervention:  Education and Support  Summary of Progress/Problems: Patient was quiet but attentive   Danel Studzinski 11/27/2011, 1:46 PM

## 2011-11-27 NOTE — Discharge Planning (Signed)
Met with patient in Aftercare Planning Group.   She was very quiet and expressed no case management needs today.  She stated that she was still awake at 1:00AM last night, and the nurse called the doctor on call for an order, which helped tremendously, and she was able to sleep after that.  Patient stated that she is starting to feel slightly less paranoid, but she does not feel safe to leave the hospital.  She does feel safe on the unit.    No case management needs today.  Ambrose Mantle, LCSW 11/27/2011, 2:56 PM

## 2011-11-27 NOTE — Tx Team (Signed)
Interdisciplinary Treatment Plan Update (Adult)  Date:  11/27/2011  Time Reviewed:  10:15AM-11:15AM  Progress in Treatment: Attending groups:  Yes Participating in groups:    Yes, fully engaged Taking medication as prescribed:    Yes, no refusals Tolerating medication:   Yes, no side effects have been reported by patient or noted by staff Family/Significant other contact made:  No, still needed with husband Patient understands diagnosis:   Yes, with fair insight and judgment Discussing patient identified problems/goals with staff:  Yes  Medical problems stabilized or resolved:   Asks for increased in her Tramadol for back pain Denies suicidal/homicidal ideation:  Yes, this morning denies, but had SI last night. Issues/concerns per patient self-inventory:   Tramadol wanted for back pain. Other:    New problem(s) identified: No, Describe:    Reason for Continuation of Hospitalization: Anxiety Delusions  Depression Medication stabilization Suicidal ideation Other; describe hopelessness, pain, paranoia  Interventions implemented related to continuation of hospitalization:  Medication monitoring and adjustment, safety checks Q15 min., suicide risk assessment, group therapy, psychoeducation, collateral contact, aftercare planning, ongoing physician assessments, medication education  Additional comments:  Not applicable  Estimated length of stay:    Discharge Plan:    New goal(s):  Not applicable  Review of initial/current patient goals per problem list:   1.  Goal(s):  Reduce threatening voices (auditory hallucinations) to baseline.  Met:  No  Target date:  By Discharge   As evidenced by:  Voices are a little lower, but still present, and they produce significant anxiety.  Especially worse at night.  2.  Goal(s):  Return sleep to normal pattern of 6+ hours.  Met:  No  Target date:  By Discharge   As evidenced by:  Had trouble initiating sleep last night due to [facility], up at  1AM for additional medication.  Gets higher anxiety just before bedtime.  3.  Goal(s):  Reduce anxiety from admission level to no greater than 3 at discharge.  Met:  No  Target date:  By Discharge   As evidenced by:  "5" today  4.  Goal(s):  Reduce depression and hopelessness from admission level to no greater than 3 at discharge.  Met:  No  Target date:  By Discharge   As evidenced by:  "8" and "5" today, respectively.  5.  Goal(s):  Deny SI for 48 hours prior to D/C.  Met:  No  Target date:  By Discharge   As evidenced by:  Was suicidal last night   6.  Goal(s):  Reduce paranoia to baseline.  Met:  No  Target date:  By Discharge  As evidenced by:  Still feels that someone is trying to get rid of her, does not feel safe going home.  7.  Goal(s): Medication stabilization  Met:  No  Target date:  By Discharge   As evidenced by:  Ongoing Attendees: Patient:  Cassie Ruiz  11/27/2011 10:15AM-11:15AM  Family:     Physician:  Dr. Harvie Heck Readling 11/27/2011 10:15AM-11:15AM  Nursing:   Izola Price, RN 11/27/2011 10:15AM -11:15AM   Case Manager:  Ambrose Mantle, LCSW 11/27/2011 10:15AM-11:15AM  Counselor:  Veto Kemps, MT-BC 11/27/2011 10:15AM-11:15AM  Other:   Verne Spurr, PA 11/27/2011 10:15AM-11:15AM  Other:      Other:      Other:       Scribe for Treatment Team:   Sarina Ser, 11/27/2011, 10:15AM-11:15AM

## 2011-11-27 NOTE — Progress Notes (Signed)
[facility] Group Notes:  (Counselor/Nursing/MHT/Case Management/Adjunct)  11/27/2011 1:44 PM  Type of Therapy:  Group Therapy 11/26/11  Participation Level:  Active  Participation Quality:  Attentive and Sharing  Affect:  Depressed  Cognitive:  Delusional  Insight:  Limited  Engagement in Group:  Good  Engagement in Therapy:  Good  Modes of Intervention:  Clarification, Education, Problem-solving and Support  Summary of Progress/Problems: Patient stated that she needs to stand up for herself, but could not be specific with whom or what needed to be said. Stated, "I haven't figured that out yet". Did not want to share why she needed to stand up for herself. Talked about need for medications.   Cassie Ruiz, [name] 11/27/2011, 1:44 PM

## 2011-11-27 NOTE — Progress Notes (Signed)
Patient very quiet, but pleasant and visible on the unit.  Has attended groups.  Interacting minimally with peers.  States she continues to hear voices at night when she is in her room and everything else is quiet.  She states that during the day she is able to ignore them or "push them aside."  States that reading the Bible helps to keep them away as well.  Continues to have fears that husband is trying to get rid of her.  Feels safe here at the hospital.

## 2011-11-27 NOTE — Progress Notes (Signed)
Asleep at long intervals since oral Benadryl 50 mg. Given (at her request)  at 01:28 for insomnia secondary to ominous  "voices"  that threaten to kill her,  or say derogatory things about her.  Beginning to be able to dispute the "voices" at times.

## 2011-11-27 NOTE — Progress Notes (Addendum)
Patient states she started her period and needs to have ibuprofen 800 mg.  She was given some sanitary pads.  Call placed to Dr. Lolly Mustache for requested ibuprofen order.  Awaiting call back at this time.  19:06- Dr. Lolly Mustache not on call.  Call placed to Dr. Dan Humphreys.  Telephone order given for ibuprofen 800 mg PO Q 6 hours PRN for menstrual cramping.  Also order CMP to be done in the morning to monitor liver enzymes.

## 2011-11-28 DIAGNOSIS — F411 Generalized anxiety disorder: Secondary | ICD-10-CM

## 2011-11-28 LAB — COMPREHENSIVE METABOLIC PANEL
AST: 12 U/L (ref 0–37)
CO2: 25 mEq/L (ref 19–32)
Calcium: 8.5 mg/dL (ref 8.4–10.5)
Creatinine, Ser: 0.6 mg/dL (ref 0.50–1.10)
GFR calc Af Amer: >90 mL/min (ref >90)
GFR calc non Af Amer: >90 mL/min (ref >90)

## 2011-11-28 MED ORDER — TRAMADOL HCL 50 MG PO TABS
100.0000 mg | ORAL_TABLET | Freq: Two times a day (BID) | ORAL | Status: DC | PRN
  Administered 2011-11-28 – 2011-12-09 (×17): 100 mg via ORAL
  Filled 2011-11-28 (×2): qty 1
  Filled 2011-11-28 (×4): qty 2
  Filled 2011-11-28: qty 1
  Filled 2011-11-28 (×4): qty 2
  Filled 2011-11-28: qty 1
  Filled 2011-11-28 (×5): qty 2
  Filled 2011-11-28: qty 1
  Filled 2011-11-28: qty 2
  Filled 2011-11-28 (×5): qty 1

## 2011-11-28 MED ORDER — SERTRALINE HCL 100 MG PO TABS
100.0000 mg | ORAL_TABLET | Freq: Every day | ORAL | Status: DC
  Administered 2011-11-29 – 2011-11-30 (×2): 100 mg via ORAL
  Filled 2011-11-28 (×4): qty 1

## 2011-11-28 MED ORDER — IBUPROFEN 800 MG PO TABS
800.0000 mg | ORAL_TABLET | Freq: Two times a day (BID) | ORAL | Status: DC | PRN
  Administered 2011-11-28 – 2011-11-29 (×2): 800 mg via ORAL
  Filled 2011-11-28 (×2): qty 1

## 2011-11-28 MED ORDER — IBUPROFEN 800 MG PO TABS
800.0000 mg | ORAL_TABLET | Freq: Two times a day (BID) | ORAL | Status: DC
  Administered 2011-11-28: 800 mg via ORAL
  Filled 2011-11-28 (×5): qty 1

## 2011-11-28 NOTE — Progress Notes (Addendum)
Patient ID: Cassie Ruiz, female   DOB: 1965-09-12, 46 y.o.   MRN: [id number] [facility] MD Progress Note  11/28/2011 3:33 PM  Diagnosis:  Axis I: Schizophrenia - Paranoid Type.   The patient was seen today and reports the following:   ADL's: Intact.  Sleep: The patient reports to having poor sleep last night. Appetite: The patient reports an increase in her appetite today  Mild>(1-10) >Severe  Hopelessness (1-10): 5  The same Depression (1-10): 7 a decrease from yesterday Anxiety (1-10): 5  The same  Suicidal Ideation: The patient denies any suicidal ideations today.  Plan: No  Intent: No  Means: No  Homicidal Ideation: The patient adamantly denies any homicidal ideations today.  Plan: No  Intent: No.  Means: No   General Appearance/Behavior: The patient was again cooperative today but paranoid in her thinking.  Eye Contact: Good.  Speech: Appropriate in rate and volume with no pressuring noted today.  Motor Behavior: wnl.  Level of Consciousness: Alert Mental Status: Oriented x 3.  Mood: Moderate to severely depressed.  Affect: Moderately constricted today.  Anxiety Level: Moderate anxiety reported today.  Thought Process: Paranoid thinking as well as auditory hallucinations remains. She stated there was a "black blob" in her room last night when the lights were out. Thought Content: The patient reports auditory hallucinations continuing as well as paranoid delusions that someone is trying to kill her in order to take her husband.  Perception: Paranoid thinking as well as auditory hallucinations remains.  Judgment: Poor.  Insight: Poor.  Cognition: Oriented to person, place and time.  Sleep:  Number of Hours: 6    Vital Signs:Blood pressure 93/58, pulse 81, temperature 98.4 F (36.9 C), temperature source Oral, resp. rate 16, height 5' 3.39" (1.61 m), weight 84.369 kg (186 lb), last menstrual period 09/24/2011.  Current Medications: Current Facility-Administered Medications    Medication Dose Route Frequency Provider Last Rate Last Dose  . acetaminophen (TYLENOL) tablet 650 mg  650 mg Oral Q6H PRN Verne Spurr, PA-C   650 mg at 11/28/11 1324  . ALPRAZolam Prudy Feeler) tablet 0.5 mg  0.5 mg Oral TID PRN Curlene Labrum Readling, MD   0.5 mg at 11/27/11 2324  . alum & mag hydroxide-simeth (MAALOX/MYLANTA) 200-200-20 MG/5ML suspension 30 mL  30 mL Oral Q4H PRN Verne Spurr, PA-C      . ARIPiprazole (ABILIFY) tablet 10 mg  10 mg Oral QHS Curlene Labrum Readling, MD   10 mg at 11/27/11 2155  . diphenhydrAMINE (BENADRYL) capsule 50 mg  50 mg Oral Q2200 Curlene Labrum Readling, MD   50 mg at 11/27/11 2155  . ibuprofen (ADVIL,MOTRIN) tablet 800 mg  800 mg Oral Q6H PRN Mike Craze, MD   800 mg at 11/28/11 4010  . ibuprofen (ADVIL,MOTRIN) tablet 800 mg  800 mg Oral BID Verne Spurr, PA-C   800 mg at 11/28/11 1315  . levothyroxine (SYNTHROID, LEVOTHROID) tablet 75 mcg  75 mcg Oral Daily Verne Spurr, PA-C   75 mcg at 11/28/11 2725  . magnesium hydroxide (MILK OF MAGNESIA) suspension 30 mL  30 mL Oral Daily PRN Verne Spurr, PA-C      . sertraline (ZOLOFT) tablet 50 mg  50 mg Oral Daily Curlene Labrum Readling, MD   50 mg at 11/28/11 3664  . traMADol (ULTRAM) tablet 100 mg  100 mg Oral Q12H PRN Ronny Bacon, MD   100 mg at 11/28/11 1007   Lab Results:  Results for orders placed during the hospital encounter  of 11/25/11 (from the past 48 hour(s))  TSH     Status: Abnormal   Collection Time   11/26/11  7:37 PM      Component Value Range Comment   TSH 0.229 (*) 0.350 - 4.500 (uIU/mL)   T3, FREE     Status: Normal   Collection Time   11/26/11  7:37 PM      Component Value Range Comment   T3, Free 3.1  2.3 - 4.2 (pg/mL)   T4, FREE     Status: Normal   Collection Time   11/26/11  7:37 PM      Component Value Range Comment   Free T4 1.60  0.80 - 1.80 (ng/dL)   COMPREHENSIVE METABOLIC PANEL     Status: Abnormal   Collection Time   11/28/11  6:23 AM      Component Value Range Comment   Sodium 139   135 - 145 (mEq/L)    Potassium 3.9  3.5 - 5.1 (mEq/L)    Chloride 107  96 - 112 (mEq/L)    CO2 25  19 - 32 (mEq/L)    Glucose, Bld 96  70 - 99 (mg/dL)    BUN 12  6 - 23 (mg/dL)    Creatinine, Ser 1.19  0.50 - 1.10 (mg/dL)    Calcium 8.5  8.4 - 10.5 (mg/dL)    Total Protein 6.0  6.0 - 8.3 (g/dL)    Albumin 3.3 (*) 3.5 - 5.2 (g/dL)    AST 12  0 - 37 (U/L)    ALT 14  0 - 35 (U/L)    Alkaline Phosphatase 59  39 - 117 (U/L)    Total Bilirubin 0.2 (*) 0.3 - 1.2 (mg/dL)    GFR calc non Af Amer >90  >90 (mL/min)    GFR calc Af Amer >90  >90 (mL/min)    Time was spent today discussing with the patient her current symptoms. The patient states that she had difficulty initiating and maintaining sleep last night until she was given 50 mgs of Benadryl.  Afterwards she states she slept well.  We will also evaluate her thyroid issues as well.  She is in agreement to increase her Abilify and if there is no improvement by tomorrow, she will be changed to [place].  She is also asking for more Tramadol for her back pain. Treatment Plan Summary:  1. Daily contact with patient to assess and evaluate symptoms and progress in treatment  2. Medication management  3. The patient will deny suicidal ideations or homicidal ideations for 48 hours prior to discharge and have a depression and anxiety rating of 3 or less. The patient will also deny any auditory or visual hallucinations or delusional thinking.  4. The patient will deny any symptoms of substance withdrawal at time of discharge.  Plan:  1. Will continue the medication Zoloft at 50 mgs po q am for depression and anxiety.  2. Will continue the medication Tramadol 100 mgs po BID  But will add Ibuprofen 800mg  BID for breakthrough pain.  3. Will increase the medication Synthroid at 100 mcgs po q am for hypothyroidism.  4, Will continue the Abilify and if she does better then increase the medication Abilify to 15mg s po qhs for delusions and hallucinations.  5.  Will continue the Benadryl 50 mgs po qhs for sleep. 6. Will start Xanax 0.5 mgs po TID - prn for anxiety. 7. Will continue to monitor.  8. Laboratory studies reviewed.  9.  Will continue to monitor.  Edwing Figley 11/28/2011, 3:33 PM

## 2011-11-28 NOTE — Progress Notes (Signed)
[facility] Group Notes:  (Counselor/Nursing/MHT/Case Management/Adjunct)  11/28/2011 12:27 PM  Type of Therapy:  Group Therapy  Participation Level:  Did Not Attend   Veto Kemps 11/28/2011, 12:27 PM

## 2011-11-28 NOTE — Progress Notes (Signed)
Pt. Is very quiet and tries to stay to herself . Is cooperative and denies SI or HI; pt. Has been participating in groups and contracts for safety,.

## 2011-11-28 NOTE — Discharge Planning (Signed)
Met with patient in Aftercare Planning Group.   She was well-groomed and alert.  She did state that the sleep medication given to her last night was not effective, and she still could not sleep, had to get up at 12:30AM-1:00AM to ask for additional help.  No case management needs expressed.  Case Manager will call for follow-up appointment at [facility].  Ambrose Mantle, LCSW 11/28/2011, 10:14 AM

## 2011-11-28 NOTE — Progress Notes (Signed)
11/28/2011  Time: 0930   Group Topic/Focus: The focus of the group is on enhancing the patients' ability to cope with stressors by understanding what coping is, why it is important, the negative effects of stress and developing healthier coping skills. Patients practice [place] and discuss how exercise can be used as a healthy coping strategy.   Participation Level:  Minimal  Participation Quality:  Attentive  Affect:  Blunted  Cognitive:  Oriented   Additional Comments: Patient able to identify positive relaxation activities she can engage in upon discharge.   Cassie Ruiz  11/28/2011 11:32 AM

## 2011-11-29 MED ORDER — PALIPERIDONE ER 6 MG PO TB24
6.0000 mg | ORAL_TABLET | ORAL | Status: DC
  Administered 2011-11-29 – 2011-12-01 (×3): 6 mg via ORAL
  Filled 2011-11-29 (×4): qty 1
  Filled 2011-11-29: qty 2
  Filled 2011-11-29: qty 1

## 2011-11-29 MED ORDER — IBUPROFEN 800 MG PO TABS
800.0000 mg | ORAL_TABLET | Freq: Four times a day (QID) | ORAL | Status: DC | PRN
  Administered 2011-11-29 – 2011-12-09 (×13): 800 mg via ORAL
  Filled 2011-11-29 (×13): qty 1

## 2011-11-29 NOTE — Progress Notes (Signed)
Patient ID: Cassie Ruiz, female   DOB: 1965/12/28, 46 y.o.   MRN: [id number] The patient spent most of the early part of the evening in her room alone coloring. She did attend evening wrap up group. Interacted appropriately in the milieu. Denies any A/V hallucinations. No delusional thoughts expressed. Reported that she has not been sleeping well. Requested a Xanax before going to bed, stating she feels anxious at bedtime preventing her from getting sleep.

## 2011-11-29 NOTE — Progress Notes (Signed)
Patient ID: Cassie Ruiz, female   DOB: [birth year], 46 y.o.   MRN: [id number] [facility] MD Progress Note  11/29/2011 3:25 PM  Diagnosis:  Axis I: Schizophrenia - Paranoid Type.   The patient was seen today and reports the following:   ADL's: Intact.  Sleep: The patient reports to having good sleep last night. Appetite: The patient reports her appetite is "fair today."  Mild>(1-10) >Severe  Hopelessness (1-10): 8  Worse today Depression (1-10): 8 worse today Anxiety (1-10): 8 also worse today  Suicidal Ideation: The patient states she has some thoughts today.  Plan: No  Intent: No  Means: No  Homicidal Ideation: The patient adamantly denies any homicidal ideations today.  Plan: No  Intent: No.  Means: No   General Appearance/Behavior: The patient was again cooperative today but paranoid in her thinking.  Eye Contact: Good.  Speech: Appropriate in rate and volume with no pressuring noted today.  Motor Behavior: wnl.  Level of Consciousness: Alert Mental Status: Oriented x 3.  Mood: increased depression reports today.  Affect: Moderately constricted today.  Anxiety Level: incresed anxiety reported today.  Thought Process: Paranoid thinking as well as auditory hallucinations remains. Still reports feeling that her paranoia is worsening.  States a 10/10.  Thought Content: The patient reports auditory hallucinations continuing as well as paranoid delusions that someone is trying to kill her in order to take her husband.  Perception: Paranoid thinking as well as auditory hallucinations remains.  Judgment: Poor.  Insight: Poor.  Cognition: Oriented to person, place and time.  Sleep:  Number of Hours: 6.5    Vital Signs:Blood pressure 105/68, pulse 80, temperature 98.1 F (36.7 C), temperature source Oral, resp. rate 16, height 5' 3.39" (1.61 m), weight 84.369 kg (186 lb), last menstrual period 09/24/2011.  Current Medications: Current Facility-Administered Medications  Medication  Dose Route Frequency Provider Last Rate Last Dose  . acetaminophen (TYLENOL) tablet 650 mg  650 mg Oral Q6H PRN Verne Spurr, PA-C   650 mg at 11/28/11 4098  . ALPRAZolam Prudy Feeler) tablet 0.5 mg  0.5 mg Oral TID PRN Curlene Labrum Readling, MD   0.5 mg at 11/27/11 2324  . alum & mag hydroxide-simeth (MAALOX/MYLANTA) 200-200-20 MG/5ML suspension 30 mL  30 mL Oral Q4H PRN Verne Spurr, PA-C      . ARIPiprazole (ABILIFY) tablet 10 mg  10 mg Oral QHS Curlene Labrum Readling, MD   10 mg at 11/27/11 2155  . diphenhydrAMINE (BENADRYL) capsule 50 mg  50 mg Oral Q2200 Curlene Labrum Readling, MD   50 mg at 11/27/11 2155  . ibuprofen (ADVIL,MOTRIN) tablet 800 mg  800 mg Oral Q6H PRN Mike Craze, MD   800 mg at 11/28/11 1191  . ibuprofen (ADVIL,MOTRIN) tablet 800 mg  800 mg Oral BID Verne Spurr, PA-C   800 mg at 11/28/11 1315  . levothyroxine (SYNTHROID, LEVOTHROID) tablet 75 mcg  75 mcg Oral Daily Verne Spurr, PA-C   75 mcg at 11/28/11 4782  . magnesium hydroxide (MILK OF MAGNESIA) suspension 30 mL  30 mL Oral Daily PRN Verne Spurr, PA-C      . sertraline (ZOLOFT) tablet 50 mg  50 mg Oral Daily Curlene Labrum Readling, MD   50 mg at 11/28/11 9562  . traMADol (ULTRAM) tablet 100 mg  100 mg Oral Q12H PRN Ronny Bacon, MD   100 mg at 11/28/11 1007   Lab Results:  Results for orders placed during the hospital encounter of 11/25/11 (from the past 48 hour(s))  TSH     Status: Abnormal   Collection Time   11/26/11  7:37 PM      Component Value Range Comment   TSH 0.229 (*) 0.350 - 4.500 (uIU/mL)   T3, FREE     Status: Normal   Collection Time   11/26/11  7:37 PM      Component Value Range Comment   T3, Free 3.1  2.3 - 4.2 (pg/mL)   T4, FREE     Status: Normal   Collection Time   11/26/11  7:37 PM      Component Value Range Comment   Free T4 1.60  0.80 - 1.80 (ng/dL)   COMPREHENSIVE METABOLIC PANEL     Status: Abnormal   Collection Time   11/28/11  6:23 AM      Component Value Range Comment   Sodium 139  135 - 145  (mEq/L)    Potassium 3.9  3.5 - 5.1 (mEq/L)    Chloride 107  96 - 112 (mEq/L)    CO2 25  19 - 32 (mEq/L)    Glucose, Bld 96  70 - 99 (mg/dL)    BUN 12  6 - 23 (mg/dL)    Creatinine, Ser 7.82  0.50 - 1.10 (mg/dL)    Calcium 8.5  8.4 - 10.5 (mg/dL)    Total Protein 6.0  6.0 - 8.3 (g/dL)    Albumin 3.3 (*) 3.5 - 5.2 (g/dL)    AST 12  0 - 37 (U/L)    ALT 14  0 - 35 (U/L)    Alkaline Phosphatase 59  39 - 117 (U/L)    Total Bilirubin 0.2 (*) 0.3 - 1.2 (mg/dL)    GFR calc non Af Amer >90  >90 (mL/min)    GFR calc Af Amer >90  >90 (mL/min)    Time was spent today discussing with the patient her current symptoms. The patient states that she had difficulty initiating and maintaining sleep last night until she was given 50 mgs of Benadryl.  Afterwards she states she slept well. Today the patient acknowledges that she is only 2% better and is open to switching to [facility] as discussed yesterday.  Her Thyroid labs were reviewed yesterday and her synthroid was adjusted.  Treatment Plan Summary:  1. Daily contact with patient to assess and evaluate symptoms and progress in treatment  2. Medication management  3. The patient will deny suicidal ideations or homicidal ideations for 48 hours prior to discharge and have a depression and anxiety rating of 3 or less. The patient will also deny any auditory or visual hallucinations or delusional thinking.  4. The patient will deny any symptoms of substance withdrawal at time of discharge.  Plan:  1. Will discontinue the Abilify. 2. Will start Invega 6mg  po qd. For psychosis 3. Will continue the other medications without changes at this time. 4. Will continue to monitor.  5. Laboratory studies reviewed.    Rona Ravens. Dezaria Methot PAC 11/29/2011, 3:25 PM

## 2011-11-29 NOTE — Progress Notes (Signed)
Patient ID: Cassie Ruiz, female   DOB: 12/25/1965, 46 y.o.   MRN: [id number]  [facility] Group Notes:  (Counselor/Nursing/MHT/Case Management/Adjunct)  11/29/2011 11 AM  Type of Therapy:  Aftercare Planning, Group Therapy, Dance/Movement Therapy   Participation Level:  Minimal  Participation Quality:  Drowsy  Affect:  Appropriate  Cognitive:  Appropriate  Insight:  Limited  Engagement in Group:  Limited  Engagement in Therapy:  Limited  Modes of Intervention:  Clarification, Problem-solving, Role-play, Socialization and Support  Summary of Progress/Problems: After Care: Pt. attended and participated in aftercare planning group. Pt. accepted information on suicide prevention, warning signs to look for with suicide and crisis line numbers to use. The pt. agreed to call crisis line numbers if having warning signs or having thoughts of suicide. Pt. listed their current mood as feeling the color pink because"I feel soft".  Counseling:  Therapist discussed various healthy methods of support when feeling tired and exhausted.  Therapist allowed group to express how to remember healthy supports through movement.  Pt. stated that she "can drink energy drinks, listen to music or call my mother".  Pt seemed to understand various means of support     Rhunette Croft

## 2011-11-29 NOTE — Progress Notes (Signed)
Pt is depressed and anxious  She is cooperative and attends groups  She interacts with select peers but minimally  She reports a low energy level and poor appetite  She reports some suicidal ideation but does contract for safety on the unit  Verbal support given  Medications administered and effectiveness monitored  Q 15 min checks  Pt safe at present

## 2011-11-29 NOTE — Progress Notes (Signed)
Patient ID: Cassie Ruiz, female   DOB: March 05, 1966, 46 y.o.   MRN: [id number] The patient has an anxious mood and affect. Early in the evening she started anticipating that she might not be able to sleep this evening. Eventually was able to relax and interacted in the group game of Pictionary.

## 2011-11-30 MED ORDER — SERTRALINE HCL 100 MG PO TABS
100.0000 mg | ORAL_TABLET | Freq: Every day | ORAL | Status: DC
  Administered 2011-12-01 – 2011-12-07 (×7): 100 mg via ORAL
  Filled 2011-11-30 (×8): qty 1

## 2011-11-30 NOTE — Progress Notes (Signed)
Patient ID: Cassie Ruiz, female   DOB: 1965/08/16, 46 y.o.   MRN: [id number] [facility] MD Progress Note  11/30/2011 3:50 PM  Diagnosis:  Axis I: Schizophrenia - Paranoid Type.   The patient was seen today and reports the following:   ADL's: Intact.  Sleep: The patient reports to having fairly good sleep last night. Appetite: The patient reports her appetite is "good today."  Mild>(1-10) >Severe  Hopelessness (1-10): 7 less today Depression (1-10): 7 less today Anxiety (1-10): 5  less today  Suicidal Ideation: The patient states she has some thoughts today, increasing over last two days. Plan: No  Intent: No  Means: No  Homicidal Ideation: The patient adamantly denies any homicidal ideations today.  Plan: No  Intent: No.  Means: No   General Appearance/Behavior: The patient was again cooperative today but paranoid in her thinking.  Eye Contact: Good.  Speech: Appropriate in rate and volume with no pressuring noted today.  Motor Behavior: wnl.   Level of Consciousness: Alert Mental Status: Oriented x 3.  Mood: increased depression reports today.  Affect: Moderately constricted today.  Anxiety Level: moderate anxiety reported today, an improvement over yesterday.  Thought Process: Paranoid thinking as well as auditory hallucinations remains. Still reports feeling that her paranoia is worsening.  States a 10/10. Thought Content: The patient reports a decrease in the auditory hallucinations.  She states "none for about a 1/2 a day now."   Perception: Paranoid thinking, auditory hallucinations improving, delusions continue.  Judgment: Poor.  Insight: Poor.  Cognition: Oriented to person, place and time.  Sleep:  Number of Hours: 6.75    Vital Signs:Blood pressure 105/68, pulse 80, temperature 98.1 F (36.7 C), temperature source Oral, resp. rate 16, height 5' 3.39" (1.61 m), weight 84.369 kg (186 lb), last menstrual period 09/24/2011.  Current Medications: Current  Facility-Administered Medications  Medication Dose Route Frequency Provider Last Rate Last Dose  . acetaminophen (TYLENOL) tablet 650 mg  650 mg Oral Q6H PRN Verne Spurr, PA-C   650 mg at 11/28/11 7829  . ALPRAZolam Prudy Feeler) tablet 0.5 mg  0.5 mg Oral TID PRN Curlene Labrum Readling, MD   0.5 mg at 11/27/11 2324  . alum & mag hydroxide-simeth (MAALOX/MYLANTA) 200-200-20 MG/5ML suspension 30 mL  30 mL Oral Q4H PRN Verne Spurr, PA-C      . ARIPiprazole (ABILIFY) tablet 10 mg  10 mg Oral QHS Curlene Labrum Readling, MD   10 mg at 11/27/11 2155  . diphenhydrAMINE (BENADRYL) capsule 50 mg  50 mg Oral Q2200 Curlene Labrum Readling, MD   50 mg at 11/27/11 2155  . ibuprofen (ADVIL,MOTRIN) tablet 800 mg  800 mg Oral Q6H PRN Mike Craze, MD   800 mg at 11/28/11 5621  . ibuprofen (ADVIL,MOTRIN) tablet 800 mg  800 mg Oral BID Verne Spurr, PA-C   800 mg at 11/28/11 1315  . levothyroxine (SYNTHROID, LEVOTHROID) tablet 75 mcg  75 mcg Oral Daily Verne Spurr, PA-C   75 mcg at 11/28/11 3086  . magnesium hydroxide (MILK OF MAGNESIA) suspension 30 mL  30 mL Oral Daily PRN Verne Spurr, PA-C      . sertraline (ZOLOFT) tablet 50 mg  50 mg Oral Daily Curlene Labrum Readling, MD   50 mg at 11/28/11 5784  . traMADol (ULTRAM) tablet 100 mg  100 mg Oral Q12H PRN Ronny Bacon, MD   100 mg at 11/28/11 1007   Lab Results:  Results for orders placed during the hospital encounter of 11/25/11 (from  the past 48 hour(s))  TSH     Status: Abnormal   Collection Time   11/26/11  7:37 PM      Component Value Range Comment   TSH 0.229 (*) 0.350 - 4.500 (uIU/mL)   T3, FREE     Status: Normal   Collection Time   11/26/11  7:37 PM      Component Value Range Comment   T3, Free 3.1  2.3 - 4.2 (pg/mL)   T4, FREE     Status: Normal   Collection Time   11/26/11  7:37 PM      Component Value Range Comment   Free T4 1.60  0.80 - 1.80 (ng/dL)   COMPREHENSIVE METABOLIC PANEL     Status: Abnormal   Collection Time   11/28/11  6:23 AM      Component  Value Range Comment   Sodium 139  135 - 145 (mEq/L)    Potassium 3.9  3.5 - 5.1 (mEq/L)    Chloride 107  96 - 112 (mEq/L)    CO2 25  19 - 32 (mEq/L)    Glucose, Bld 96  70 - 99 (mg/dL)    BUN 12  6 - 23 (mg/dL)    Creatinine, Ser 8.11  0.50 - 1.10 (mg/dL)    Calcium 8.5  8.4 - 10.5 (mg/dL)    Total Protein 6.0  6.0 - 8.3 (g/dL)    Albumin 3.3 (*) 3.5 - 5.2 (g/dL)    AST 12  0 - 37 (U/L)    ALT 14  0 - 35 (U/L)    Alkaline Phosphatase 59  39 - 117 (U/L)    Total Bilirubin 0.2 (*) 0.3 - 1.2 (mg/dL)    GFR calc non Af Amer >90  >90 (mL/min)    GFR calc Af Amer >90  >90 (mL/min)    Time was spent today discussing with the patient her current symptoms. The patient notes that she is indeed improved since her arrival, but we both agree that she is still "not where she needs to be, in order to return home."  She is genuinely anxious about returning home.  Ossie did receive a dose of Invega yesterday afternoon and another this morning.  She is also requesting that her Zoloft be switched to the bed time dose to prevent day time sleepiness.   Treatment Plan Summary:  1. Daily contact with patient to assess and evaluate symptoms and progress in treatment  2. Medication management  3. The patient will deny suicidal ideations or homicidal ideations for 48 hours prior to discharge and have a depression and anxiety rating of 3 or less. The patient will also deny any auditory or visual hallucinations or delusional thinking.  4. The patient will deny any symptoms of substance withdrawal at time of discharge.  Plan:  1. Will discontinue the Abilify. 2. Will continue the Invega. 3. Will change zoloft to bedtime dosing. 3. Will continue the other medications without changes at this time. 4. Will continue to monitor.  5. Laboratory studies reviewed.    Rona Ravens. Adeline Petitfrere PAC 11/30/2011, 3:50 PM

## 2011-11-30 NOTE — Progress Notes (Signed)
Pt is anxious and has been care taking her roommate today  Pt did not attend groups this morning and has isolated to her room most of the day   She asks for pain medications frequently and still complains of cramps from her period   Pt endorses suicidal ideation but contracts for safety on the unit  verbal support given  Medications administered and effectiveness monitored  Q 15 min checks  Pt safe at present

## 2011-11-30 NOTE — Progress Notes (Signed)
Patient ID: Cassie Ruiz, female   DOB: 1965-12-27, 46 y.o.   MRN: [id number] The patient is anxious and depressed. She admits to suicidal ideation without a plan, but contracts for safety. Plays the care giver role towards her roommate. Both needed gentle reminders that they needed to focus on their own problems and to utilize the help provided by staff. No delusional thoughts expressed this evening.

## 2011-12-01 MED ORDER — PALIPERIDONE ER 3 MG PO TB24
3.0000 mg | ORAL_TABLET | ORAL | Status: DC
  Administered 2011-12-02: 3 mg via ORAL
  Filled 2011-12-01 (×5): qty 1

## 2011-12-01 MED ORDER — PALIPERIDONE ER 3 MG PO TB24
3.0000 mg | ORAL_TABLET | Freq: Every day | ORAL | Status: DC
  Filled 2011-12-01: qty 1

## 2011-12-01 MED ORDER — PALIPERIDONE ER 6 MG PO TB24: 6.0000 mg | ORAL_TABLET | Freq: Every day | ORAL | Status: DC

## 2011-12-01 MED ORDER — PALIPERIDONE ER 6 MG PO TB24
6.0000 mg | ORAL_TABLET | Freq: Every day | ORAL | Status: DC
  Administered 2011-12-02: 6 mg via ORAL
  Filled 2011-12-01 (×4): qty 1

## 2011-12-01 MED ORDER — PALIPERIDONE ER 3 MG PO TB24
3.0000 mg | ORAL_TABLET | ORAL | Status: DC
  Filled 2011-12-01 (×2): qty 1

## 2011-12-01 NOTE — Progress Notes (Signed)
12/01/2011         Time: 0930      Group Topic/Focus: The focus of the group is on enhancing the patients' ability to utilize positive relaxation strategies by practicing several that can be used at discharge.  Participation Level: Minimal  Participation Quality: Attentive  Affect: Depressed  Cognitive: Alert   Additional Comments: Patient missed half of group as she was meeting with the MD. Patient reports she had a nightmare last night and was unable to sleep.   Briseyda Fehr 12/01/2011 10:08 AM

## 2011-12-01 NOTE — Progress Notes (Signed)
Pt has been up and has been visible in milieu today, limited interaction or participation in the milieu today, pt has endorsed feelings of depression and hopelessness today and talked about not sleeping well last night because of nightmares, pt has c/o back pain today and requested and received medication for the pain, pt provided with support, will continue to monitor

## 2011-12-01 NOTE — Progress Notes (Signed)
[facility] Group Notes:  (Counselor/Nursing/MHT/Case Management/Adjunct)  12/01/2011 2:40 PM  Type of Therapy:  Group Therapy  Participation Level:  Did Not Attend     Veto Kemps 12/01/2011, 2:40 PM

## 2011-12-01 NOTE — Progress Notes (Signed)
[facility] MD Progress Note  12/01/2011 4:36 PM  Diagnosis:  Axis I: Schizophrenia - Paranoid Type.   The patient was seen today and reports the following:   ADL's: Intact.  Sleep: The patient reports nightmares last night. Appetite: The patient reports her appetite is "fair."  Mild>(1-10) >Severe  Hopelessness (1-10): 8 up today Depression (1-10): 8 up today Anxiety (1-10):  8 up today  Suicidal Ideation: The patient states she has some suicidal thoughts today stating "she can't go on living like this." Plan: No  Intent: No  Means: No  Homicidal Ideation: The patient adamantly denies any homicidal ideations today.  Plan: No  Intent: No.  Means: No   General Appearance/Behavior: The patient was again cooperative today but paranoid in her thinking.  Eye Contact: Fair Speech: softer in volume today. Motor Behavior: wnl.   Level of Consciousness: Alert Mental Status: Oriented x 3.  Mood: increased depression reports today.  Affect: Moderately constricted today. Looks more depressed today. Anxiety Level: moderate anxiety reported today, an increase over yesterday.  Thought Process: Paranoid thinking as well as auditory hallucinations remains. Still reports feeling that her paranoia is worsening.  States a 10/10. Thought Content: The patient reports a decrease in the auditory hallucinations.  She states "none for about a day and 1/2 now."   Perception: Paranoid thinking, auditory hallucinations improving, delusions continue.  Judgment: Poor.  Insight: Poor.  Cognition: Oriented to person, place and time.  Sleep:  Number of Hours: 6.25    Filed Vitals:   12/01/11 0736  BP: 96/65  Pulse: 74  Temp:  98.0  Resp:  20   Current Medications: Current Facility-Administered Medications  Medication Dose Route Frequency Provider Last Rate Last Dose  . acetaminophen (TYLENOL) tablet 650 mg  650 mg Oral Q6H PRN Verne Spurr, PA-C   650 mg at 11/28/11 1610  . ALPRAZolam Prudy Feeler) tablet  0.5 mg  0.5 mg Oral TID PRN Curlene Labrum Readling, MD   0.5 mg at 11/27/11 2324  . alum & mag hydroxide-simeth (MAALOX/MYLANTA) 200-200-20 MG/5ML suspension 30 mL  30 mL Oral Q4H PRN Verne Spurr, PA-C      . ARIPiprazole (ABILIFY) tablet 10 mg  10 mg Oral QHS Curlene Labrum Readling, MD   10 mg at 11/27/11 2155  . diphenhydrAMINE (BENADRYL) capsule 50 mg  50 mg Oral Q2200 Curlene Labrum Readling, MD   50 mg at 11/27/11 2155  . ibuprofen (ADVIL,MOTRIN) tablet 800 mg  800 mg Oral Q6H PRN Mike Craze, MD   800 mg at 11/28/11 9604  . ibuprofen (ADVIL,MOTRIN) tablet 800 mg  800 mg Oral BID Verne Spurr, PA-C   800 mg at 11/28/11 1315  . levothyroxine (SYNTHROID, LEVOTHROID) tablet 75 mcg  75 mcg Oral Daily Verne Spurr, PA-C   75 mcg at 11/28/11 5409  . magnesium hydroxide (MILK OF MAGNESIA) suspension 30 mL  30 mL Oral Daily PRN Verne Spurr, PA-C      . sertraline (ZOLOFT) tablet 50 mg  50 mg Oral Daily Curlene Labrum Readling, MD   50 mg at 11/28/11 8119  . traMADol (ULTRAM) tablet 100 mg  100 mg Oral Q12H PRN Curlene Labrum Readling, MD   100 mg at 11/28/11 1007   Lab Results:  None in the last 24 hours.  Time was spent today discussing with the patient her current symptoms. She is a little more subdued today and appears more depressed.  Upon further discussion she elaborates that her hallucinations had not increased but  she was including the nightmare she had last night as part of her report.  This is clarified.   Treatment Plan Summary:  1. Daily contact with patient to assess and evaluate symptoms and progress in treatment  2. Medication management  3. The patient will deny suicidal ideations or homicidal ideations for 48 hours prior to discharge and have a depression and anxiety rating of 3 or less. The patient will also deny any auditory or visual hallucinations or delusional thinking.  4. The patient will deny any symptoms of substance withdrawal at time of discharge.  Plan:  1. Will change the invega to 3mg  in  AM, and 6 mg HS. Per Dr. Allena Katz. 2. Will continue the Invega. 3. Will change zoloft to bedtime dosing. 3. Will continue the other medications without changes at this time. 4. Will continue to monitor.  5. Laboratory studies reviewed.    Rona Ravens. Faisal Stradling PAC 12/01/2011, 4:36 PM

## 2011-12-01 NOTE — Progress Notes (Signed)
Pt. Reports doing  "fair" this evening and that the voices are doing better.  Pt. Contracts for safety.  Encouragement and support given.  Pt. Receptive.

## 2011-12-01 NOTE — Discharge Planning (Signed)
Met with patient in Aftercare Planning Group.   She was pleasant and smiling as usual, but stated that she had nightmares last night and thus did not sleep well.   Was unable to answer in a meaningful way when Case Manager asked her how husband feels she is doing.  No case management needs today.  Ambrose Mantle, LCSW 12/01/2011, 1:02 PM

## 2011-12-02 MED ORDER — TRAZODONE HCL 100 MG PO TABS
100.0000 mg | ORAL_TABLET | Freq: Every evening | ORAL | Status: DC | PRN
  Administered 2011-12-03 – 2011-12-08 (×5): 100 mg via ORAL
  Filled 2011-12-02 (×5): qty 1
  Filled 2011-12-02: qty 3

## 2011-12-02 NOTE — Progress Notes (Signed)
Pt is paranoid with delusion that a man and his wife plan to kill her by putting a tube down her throat and making it appear to be suicide. Pt reports that voices have told her this and she had not heard the voice in two days. Offered support, and redirection. Pt denies si and hi. Safety maintained on unit.

## 2011-12-02 NOTE — Discharge Planning (Signed)
Met with patient in Aftercare Planning Group.   She stated she still is not sleeping well, and had woken up at 4:00am disturbed and unable to go back to sleep.  Patient had asked yesterday to see Case Manager individually today, so we did this today.  She wanted to know what her options would be if she did not go back to live with her husband.  She stated there are no other family members she can go stay with.  Case Manager told her that other immediate options would be a women's shelter or regular shelter.  She wanted the telephone numbers for domestic violence shelters.  Case Manager provided her the facilities throughout the state, and later in the day she said she had circled several she is interested in.  Utilization review done for additional days.  Ambrose Mantle, LCSW 12/02/2011, 4:30 PM

## 2011-12-02 NOTE — Progress Notes (Signed)
Currently resting quietly in bed in right lateral position with eyes closed. Respirations are even and unlabored. No acute distress or pain noted. Safety has been maintained with Q15 minute observation. Will continue current POC.

## 2011-12-02 NOTE — Progress Notes (Signed)
Has been generally withdrawn to room this shift. Appears flat and depressed. Somewhat anxious during assessment. No acute distress noted. States she continues to feel extremely paranoid that two specific individuals were plaotting to kill her. Support and encouragement provided. Discussed conversation we had this morning where pt expressed reservations about taking certain med in the AM and planning to talk with MD about it. States she was too sleepy today and did not get a chance. Encouraged to f/u tomorrow. Did endorse pain and anxiety and requested prn for both which were provided. Denies SI/HI/AVH and contracts for safety. POC and medications for the shift reviewed and understanding verbalized. Safety has been maintained with Q15 minute observation. Will continue current POC.

## 2011-12-02 NOTE — Progress Notes (Signed)
[facility] MD Progress Note  12/02/2011 4:42 PM  Diagnosis:  Axis I: Schizophrenia - Paranoid Type.   The patient was seen today and reports the following:   ADL's: Intact.  Sleep: The patient reports to having difficulty initiating and maintaining sleep last night. Appetite: The patient reports a good appetite today.   Mild>(1-10) >Severe  Hopelessness (1-10): 8  Depression (1-10): 8  Anxiety (1-10): 8   Suicidal Ideation: The patient reports suicidal ideations today but with no plan or intent.  She states "I can't live like this" referring to her hallucinations and delusions. Plan: No  Intent: No  Means: No  Homicidal Ideation: The patient adamantly denies any homicidal ideations today.  Plan: No  Intent: No.  Means: No   General Appearance/Behavior: The patient was again cooperative today but remains paranoid in her thinking.  Eye Contact: Good.  Speech: Appropriate in rate and volume with no pressuring noted today.  Motor Behavior: wnl.  Level of Consciousness: Alert and Oriented x 3.  Mental Status: Alert and Oriented x 3.  Mood: Severely depressed.  Affect: Moderately constricted today.  Anxiety Level: Moderate to severe anxiety reported today.  Thought Process: Paranoid thinking remains.  Thought Content: The patient denies any auditory or visual hallucinations today.  She reports continuing paranoid delusions that someone is trying to kill her in order to take her husband.  Perception: Paranoid thinking remains.  Judgment: Poor.  Insight: Poor.  Cognition: Oriented to person, place and time.  Sleep:  Number of Hours: 5.5    Vital Signs:Blood pressure 105/69, pulse 80, temperature 97.7 F (36.5 C), temperature source Oral, resp. rate 20, height 5' 3.39" (1.61 m), weight 84.369 kg (186 lb), last menstrual period 09/24/2011.  Current Medications: Current Facility-Administered Medications  Medication Dose Route Frequency Provider Last Rate Last Dose  . acetaminophen  (TYLENOL) tablet 650 mg  650 mg Oral Q6H PRN Verne Spurr, PA-C   650 mg at 12/02/11 1156  . ALPRAZolam Prudy Feeler) tablet 0.5 mg  0.5 mg Oral TID PRN Ronny Bacon, MD   0.5 mg at 12/01/11 2138  . alum & mag hydroxide-simeth (MAALOX/MYLANTA) 200-200-20 MG/5ML suspension 30 mL  30 mL Oral Q4H PRN Verne Spurr, PA-C      . diphenhydrAMINE (BENADRYL) capsule 50 mg  50 mg Oral Q2200 Curlene Labrum Ruddy Swire, MD   50 mg at 12/01/11 2139  . ibuprofen (ADVIL,MOTRIN) tablet 800 mg  800 mg Oral Q6H PRN Verne Spurr, PA-C   800 mg at 12/01/11 1943  . levothyroxine (SYNTHROID, LEVOTHROID) tablet 75 mcg  75 mcg Oral Daily Verne Spurr, PA-C   75 mcg at 12/02/11 1610  . magnesium hydroxide (MILK OF MAGNESIA) suspension 30 mL  30 mL Oral Daily PRN Verne Spurr, PA-C      . paliperidone (INVEGA) 24 hr tablet 3 mg  3 mg Oral BH-q7a Verne Spurr, PA-C   3 mg at 12/02/11 9604  . paliperidone (INVEGA) 24 hr tablet 6 mg  6 mg Oral QHS Verne Spurr, PA-C      . sertraline (ZOLOFT) tablet 100 mg  100 mg Oral QHS Verne Spurr, PA-C   100 mg at 12/01/11 2138  . traMADol (ULTRAM) tablet 100 mg  100 mg Oral Q12H PRN Curlene Labrum March Joos, MD   100 mg at 12/01/11 2205  . DISCONTD: paliperidone (INVEGA) 24 hr tablet 3 mg  3 mg Oral QHS Verne Spurr, PA-C      . DISCONTD: paliperidone (INVEGA) 24 hr tablet 3 mg  3 mg Oral BH-qamhs Verne Spurr, PA-C       Lab Results:  No results found for this or any previous visit (from the past 48 hour(s)).  Time was spent today discussing with the patient her current symptoms.  The patient states that she is continuing to have difficulty initiating and maintaining sleep.  She reports a good appetite but reports severe feelings of sadness, anhedonia and depressed mood.  She reports some suicidal ideations related to her fear that the paranoid delusions will continue.  She denies any plan or intent.  The patient denies any auditory or visual hallucinations today and reports moderate to severe  anxiety. It was discussed with the patient the plan to further increase the medication Invega and she agreed with this plan.   Treatment Plan Summary:  1. Daily contact with patient to assess and evaluate symptoms and progress in treatment  2. Medication management  3. The patient will deny suicidal ideations or homicidal ideations for 48 hours prior to discharge and have a depression and anxiety rating of 3 or less. The patient will also deny any auditory or visual hallucinations or delusional thinking.  4. The patient will deny any symptoms of substance withdrawal at time of discharge.  Plan:  1. Will continue the medication Zoloft at 100 mgs po q am for depression and anxiety.  2. Will continue the medication Tramadol 100 mgs po BID - prn for chronic back pain.  3. Will continue the medication Synthroid at 75 mcgs po q am for hypothyroidism.  4, Will increase the medication Invega to 3 mgs po q am and 6 mgs po qhs for delusions and hallucinations.  5. Will continue the medication Benadryl at 50 mgs po qhs for sleep. 6. Will continue the medication Xanax 0.5 mgs po TID - prn for anxiety. 7. Will continue to monitor.  8. Laboratory studies reviewed.  9. Will continue to monitor.  Cassie Ruiz 12/02/2011, 4:42 PM

## 2011-12-03 MED ORDER — PALIPERIDONE ER 6 MG PO TB24
9.0000 mg | ORAL_TABLET | Freq: Every day | ORAL | Status: DC
  Administered 2011-12-03 – 2011-12-06 (×4): 9 mg via ORAL
  Filled 2011-12-03 (×6): qty 1

## 2011-12-03 NOTE — Progress Notes (Signed)
[facility] MD Progress Note  12/03/2011 3:37 PM  Diagnosis:    Axis I: Schizophrenia - Paranoid Type.  Hospital Day # 8 The patient was seen today and reports the following:   ADL's: Intact.  Sleep: The patient reports to having difficult time sleeping last night related to her roommate's snoring, and a disturbance in the hall by another patient. Appetite: The patient reports a good appetite today.   Mild>(1-10) >Severe  Hopelessness (1-10): 7-8 the same Depression (1-10): 8 the same Anxiety (1-10): 8 the same  Suicidal Ideation: The patient reports suicidal ideations today but with no plan or intent.  Plan: No  Intent: No  Means: No  Homicidal Ideation: The patient adamantly denies any homicidal ideations today.  Plan: No  Intent: No.  Means: No   General Appearance/Behavior: The patient was again cooperative today but remains paranoid in her thinking. Eye Contact: Good.  Speech: soft in volume, normal in rate Motor Behavior: wnl.   Level of Consciousness: Alert  Mental Status: Oriented x 3.  Mood:  depressed.   Affect: Moderately constricted today.  Anxiety Level: Moderate to severe anxiety reported today.   Thought Process: Paranoid thinking remains.  Thought Content: The patient denies any auditory or visual hallucinations today.  She reports continuing paranoid delusions that someone is trying to kill her in order to take her husband.   Perception: Paranoid thinking remains.  Judgment: Poor.  Insight: Poor.  Cognition: Oriented to person, place and time.    Sleep:  Number of Hours: 5    Vital Signs:Blood pressure 92/61, pulse 92, temperature 97 F (36.1 C), temperature source Oral, resp. rate 16, height 5' 3.39" (1.61 m), weight 84.369 kg (186 lb), last menstrual period 09/24/2011.  Current Medications: Current Facility-Administered Medications  Medication Dose Route Frequency Provider Last Rate Last Dose  . acetaminophen (TYLENOL) tablet 650 mg  650 mg Oral Q6H  PRN Verne Spurr, PA-C   650 mg at 12/02/11 1156  . ALPRAZolam Prudy Feeler) tablet 0.5 mg  0.5 mg Oral TID PRN Curlene Labrum Readling, MD   0.5 mg at 12/02/11 2154  . alum & mag hydroxide-simeth (MAALOX/MYLANTA) 200-200-20 MG/5ML suspension 30 mL  30 mL Oral Q4H PRN Verne Spurr, PA-C      . diphenhydrAMINE (BENADRYL) capsule 50 mg  50 mg Oral Q2200 Curlene Labrum Readling, MD   50 mg at 12/02/11 2151  . ibuprofen (ADVIL,MOTRIN) tablet 800 mg  800 mg Oral Q6H PRN Verne Spurr, PA-C   800 mg at 12/01/11 1943  . levothyroxine (SYNTHROID, LEVOTHROID) tablet 75 mcg  75 mcg Oral Daily Verne Spurr, PA-C   75 mcg at 12/03/11 0645  . magnesium hydroxide (MILK OF MAGNESIA) suspension 30 mL  30 mL Oral Daily PRN Verne Spurr, PA-C      . paliperidone (INVEGA) 24 hr tablet 3 mg  3 mg Oral BH-q7a Verne Spurr, PA-C   3 mg at 12/02/11 1610  . paliperidone (INVEGA) 24 hr tablet 6 mg  6 mg Oral QHS Verne Spurr, PA-C   6 mg at 12/02/11 2151  . sertraline (ZOLOFT) tablet 100 mg  100 mg Oral QHS Verne Spurr, PA-C   100 mg at 12/02/11 2151  . traMADol (ULTRAM) tablet 100 mg  100 mg Oral Q12H PRN Curlene Labrum Readling, MD   100 mg at 12/03/11 0956  . traZODone (DESYREL) tablet 100 mg  100 mg Oral QHS PRN Viviann Spare, FNP   100 mg at 12/03/11 0017   Lab Results:  No results found for this or any previous visit (from the past 48 hour(s).  Time was spent today discussing with the patient her current symptoms. The patient appears very flat affected today. She reports that she is having difficulty sleeping and then followed up to explain that her sleep was disrupted due to the noise levels.  Cassie Ruiz also indicated that at night she heard voices whispering to her that "they will come back when she leaves the hospital." On further questioning she admitted that it was more of a negative bit of fantasizing and then realized that they weren't hallucinations at all. Cassie Ruiz also asked if she could have all of her Invega at bed time as the  daytime dose was making her too sleepy.  It was not revealed that she had actually refused her Invega this morning until the nurse in  treatment team reported this.  Cassie Ruiz states she is still quite worried about going home and has a lot of paranoia about leaving the hospital too soon.  Treatment Plan Summary:  1. Daily contact with patient to assess and evaluate symptoms and progress in treatment  2. Medication management  3. The patient will deny suicidal ideations or homicidal ideations for 48 hours prior to discharge and have a depression and anxiety rating of 3 or less. The patient will also deny any auditory or visual hallucinations or delusional thinking.  4. The patient will deny any symptoms of substance withdrawal at time of discharge.  Plan:  1. Will continue the medication Zoloft at 100 mgs po qhs for depression and anxiety.  2. Will continue the medication Tramadol 100 mgs po BID - prn for chronic back pain.  3. Will continue the medication Synthroid at 75 mcgs po q am for hypothyroidism.  4. Will continue the Invega at 9mg  at HS. 5. Will continue the medication Benadryl at 50 mgs po qhs for sleep. 6. Will continue the medication Xanax 0.5 mgs po TID - prn for anxiety. 7. Will continue to monitor.  8. Laboratory studies reviewed.  9. Will continue to monitor.  Rona Ravens.Winefred Hillesheim [facility] 12/03/2011

## 2011-12-03 NOTE — Progress Notes (Signed)
Pt states she slept poor. Pt refused her Hinda Glatter this am because, "it makes me sleep during the day, and stay awake all night." Reported this to pt's MD. Pt rates depression and hopelessness at a 8, states she has "off and on" suicidal thoughts, but responds to safety. Pt states, "the voices come back when I get D/Ced." Pt asked for and received Ultram.

## 2011-12-03 NOTE — Progress Notes (Signed)
Patient appeared slow, flat and depressed. Although she reported feeling better. She denied SI/HI and denied hallucinations. Patient stated that her am Cassie Ruiz needs to be discontinued because it made her drowsy during the day ,but she still needs HS dosage because that is the only thing that helps her with the voices. She is participating in activities. She complaint of head ache and received Tylenol 650 mg for that. Q 15 minute check continues to maintain safety.

## 2011-12-04 NOTE — Tx Team (Signed)
Interdisciplinary Treatment Plan Update (Adult)  Date:  12/04/2011  Time Reviewed:  10:15AM-11:15AM  Progress in Treatment: Attending groups:   Yes Participating in groups:     Yes Taking medication as prescribed:     Yes Tolerating medication:    Yes Family/Significant other contact made:   Yes Patient understands diagnosis:    Yes Discussing patient identified problems/goals with staff:  Yes   Medical problems stabilized or resolved:    Yes Denies suicidal/homicidal ideation:  No, has SI off and on, "a few times today" Issues/concerns per patient self-inventory:   None Other:    New problem(s) identified: No, Describe:    Reason for Continuation of Hospitalization: Anxiety Delusions  Depression Other; describe paranoia, hopelessness  Interventions implemented related to continuation of hospitalization:  Medication monitoring and adjustment, safety checks Q15 min., suicide risk assessment, group therapy, psychoeducation, collateral contact, aftercare planning, ongoing physician assessments, medication education  Additional comments:  Not applicable  Estimated length of stay:  2-3 days  Discharge Plan:  Return home to live with husband, follow up with [facility]  New goal(s):  Not applicable  Review of initial/current patient goals per problem list:   1.  Goal(s):  Reduce threatening voices (auditory hallucinations) to baseline.  Met:  Yes  Target date:  By Discharge   As evidenced by:  Denies voices  2.  Goal(s):  Return sleep to normal pattern of 6+ hours.  Met:  Yes  Target date:  By Discharge   As evidenced by:  States she is sleeping normally  3.  Goal(s): Reduce anxiety from admission level to no greater than 3 at discharge.  Met:  No  Target date:  By Discharge   As evidenced by:  "7" today  4.  Goal(s):  Reduce depression and hopelessness from admission level to no greater than 3 at discharge.  Met:  No  Target date:  By  Discharge   As evidenced by:  "7" and "5" today  5. Goal(s): Deny SI for 48 hours prior to D/C.  Met: No  Target date: By Discharge  As evidenced by: Suicidal "a few times" today  6. Goal(s): Reduce paranoia to baseline.  Met: No  Target date: By Discharge   As evidenced by: Still paranoid delusions, but decreasing  7. Goal(s): Medication stabilization  Met: No  Target date: By Discharge  As evidenced by: Ongoing  Attendees: Patient:  Cassie Ruiz  12/04/2011 10:15AM-11:15AM  Family:     Physician:  Dr. Harvie Heck Readling 12/04/2011 10:15AM-11:15AM  Nursing:   , RN 12/04/2011 10:15AM -11:15AM   Case Manager:  Ambrose Mantle, LCSW 12/04/2011 10:15AM-11:15AM  Counselor:  Veto Kemps, MT-BC 12/04/2011 10:15AM-11:15AM  Other:   Verne Spurr, PA 12/04/2011 10:15AM-11:15AM  Other:      Other:      Other:       Scribe for Treatment Team:   Sarina Ser, 12/04/2011, 10:15AM-11:15AM

## 2011-12-04 NOTE — Progress Notes (Signed)
Pt states she slept fair, appetite is good, energy low focus improving. Pt rates her depression/hopelessness at a 7. Going to groups, contracts for safety for "off and on" SI. Mood stable, cooperative.

## 2011-12-04 NOTE — Progress Notes (Signed)
Currently resting quietly in bed with eyes closed. Respirations are even and unlabored. No acute distress or discomfort noted. Safety has been maintained with Q15 minute observation. Will continue current POC. 

## 2011-12-04 NOTE — Progress Notes (Signed)
[facility] Group Notes:  (Counselor/Nursing/MHT/Case Management/Adjunct)  12/04/2011 8:28 AM  Type of Therapy:  Group Therapy/Psychoeducation 12/03/11  Participation Level:  Active  Participation Quality:  Attentive and Sharing  Affect:  Depressed  Cognitive:  Oriented  Insight:  Limited  Engagement in Group:  Good  Engagement in Therapy:  Good  Modes of Intervention:  Education, Problem-solving and Support  Summary of Progress/Problems: Patient stated the voices have decreased but that she still has concerns about her husband and those people. Thinks they would try to make her kill herself even though she doesn't want to. Attentive to speaker from mental health association.   Frona Yost, [name] 12/04/2011, 8:28 AM

## 2011-12-04 NOTE — Progress Notes (Signed)
Adult Services Patient-Family Contact/Session  Attendees:  Patient's husband, Leonette Most [id number]) Late entry: 12/02/11 Goal(s):  Update, discharge planning  Safety Concerns:  Patient's continued delusion about husband  Narrative:  Husband stated that he had been talking to patient but that she would not let him visit the hospital. Reported to him that her sleep has improved slightly but that she continues to be paranoid and delusional about him and others. Feels safe in the hospital. Questioned him about what he thought was different when she went home. He stated that for a while she had done well. When she had left the hospital  he had agreed to give her medications, but she had the prescription filled and then he never saw them again. Discussed that him giving her medications would be part of the discharge plan. Questioned husband if he had a plan for alternate living arrangements in case she remained suspicious and paranoid of him. He stated there was nobody else. Reviewed with him changes in medications from doctor's notes.  Barrier(s): Patient's non-compliance with medications   Interventions:  Discharge planning, support, information  Recommendation(s):  Continued inpatient for stabilization of psychosis. Follow-up for medications and compliance.  Follow-up Required:  Yes  Explanation:  Update and continued discharge planning  Katye Valek, [name] 12/04/2011, 8:30 AM

## 2011-12-04 NOTE — Progress Notes (Signed)
Patient appeared sad and depressed at the beginning of this shift. She complaint of having pain and requested for Ibuprofen for her back pain. Pt received 800 mg of Ibuprofen PRN pain medication. Writer encouraged patient to attend wrap up sing/dance group. She appeared somewhat bright when she returned. Patient stated that she enjoyed it. Patient endorsed passive  SI; no plan, and denied Hallucination She requested for Xanax and HS. Pt received bedtime medications including prn xanax and went to bed. Q 15 minute check continues to maintain safety.

## 2011-12-04 NOTE — Progress Notes (Signed)
[facility] MD Progress Note  12/04/2011 4:27 PM  Diagnosis:  Axis I: Schizophrenia - Paranoid Type.   The patient was seen today and reports the following:   ADL's: Intact.  Sleep: The patient reports to sleeping well last night. Appetite: The patient reports a good appetite today.   Mild>(1-10) >Severe  Hopelessness (1-10): 5  Depression (1-10): 7  Anxiety (1-10): 7   Suicidal Ideation: The patient reports "a few suicidal ideations today" but with no plan or intent.   Plan: No  Intent: No  Means: No  Homicidal Ideation: The patient adamantly denies any homicidal ideations today.  Plan: No  Intent: No.  Means: No   General Appearance/Behavior: The patient remains cooperative today with this provider. Eye Contact: Good.  Speech: Appropriate in rate and volume with no pressuring noted today.  Motor Behavior: wnl.  Level of Consciousness: Alert and Oriented x 3.  Mental Status: Alert and Oriented x 3.  Mood: Moderately depressed.  Affect: Moderately constricted today.  Anxiety Level: Moderate anxiety reported today.  Thought Process: Paranoid thinking remains but much improved.  Thought Content: The patient denies any auditory or visual hallucinations today.  Cassie reports continuing paranoid delusions which are much improved.  Perception: Paranoid thinking remains but much improved.  Judgment: Fair.  Insight: Fair.  Cognition: Oriented to person, place and time.  Sleep:  Number of Hours: 6.5    Vital Signs:Blood pressure 93/60, pulse 81, temperature 97.9 F (36.6 C), temperature source Oral, resp. rate 18, height 5' 3.39" (1.61 m), weight 84.369 kg (186 lb), last menstrual period 09/24/2011.  Current Medications: Current Facility-Administered Medications  Medication Dose Route Frequency Provider Last Rate Last Dose  . acetaminophen (TYLENOL) tablet 650 mg  650 mg Oral Q6H PRN Verne Spurr, PA-C   650 mg at 12/03/11 1637  . ALPRAZolam Prudy Feeler) tablet 0.5 mg  0.5 mg Oral TID PRN  Ronny Bacon, MD   0.5 mg at 12/03/11 2159  . alum & mag hydroxide-simeth (MAALOX/MYLANTA) 200-200-20 MG/5ML suspension 30 mL  30 mL Oral Q4H PRN Verne Spurr, PA-C      . diphenhydrAMINE (BENADRYL) capsule 50 mg  50 mg Oral Q2200 Curlene Labrum England Greb, MD   50 mg at 12/03/11 2132  . ibuprofen (ADVIL,MOTRIN) tablet 800 mg  800 mg Oral Q6H PRN Verne Spurr, PA-C   800 mg at 12/03/11 1833  . levothyroxine (SYNTHROID, LEVOTHROID) tablet 75 mcg  75 mcg Oral Daily Verne Spurr, PA-C   75 mcg at 12/04/11 [phone]  . magnesium hydroxide (MILK OF MAGNESIA) suspension 30 mL  30 mL Oral Daily PRN Verne Spurr, PA-C      . paliperidone (INVEGA) 24 hr tablet 9 mg  9 mg Oral QHS Verne Spurr, PA-C   9 mg at 12/03/11 2132  . sertraline (ZOLOFT) tablet 100 mg  100 mg Oral QHS Verne Spurr, PA-C   100 mg at 12/03/11 2132  . traMADol (ULTRAM) tablet 100 mg  100 mg Oral Q12H PRN Curlene Labrum Dayannara Pascal, MD   100 mg at 12/04/11 1431  . traZODone (DESYREL) tablet 100 mg  100 mg Oral QHS PRN Viviann Spare, FNP   100 mg at 12/03/11 0017   Lab Results:  No results found for this or any previous visit (from the past 48 hour(s)).  Time was spent today discussing with the patient her current symptoms.  The patient states that Cassie is sleeping well at night and reports a good appetite today.  Cassie reports moderate feelings  of sadness, anhedonia and depressed mood and reports "a few suicidal ideations" but with no plan or intent.  Cassie adamantly denies any homicidal ideations.  The patient denies any auditory or visual hallucinations today and states the severity of her paranoid delusions are diminishing.  Cassie reports moderate anxiety and denies any medication related side effects today.  Treatment Plan Summary:  1. Daily contact with patient to assess and evaluate symptoms and progress in treatment  2. Medication management  3. The patient will deny suicidal ideations or homicidal ideations for 48 hours prior to discharge and  have a depression and anxiety rating of 3 or less. The patient will also deny any auditory or visual hallucinations or delusional thinking.  4. The patient will deny any symptoms of substance withdrawal at time of discharge.  Plan:  1. Will continue the medication Zoloft at 100 mgs po q am for depression and anxiety.  2. Will continue the medication Tramadol 100 mgs po BID - prn for chronic back pain.  3. Will continue the medication Synthroid at 75 mcgs po q am for hypothyroidism.  4, Will increase the medication Invega at 9 mgs po qhs for delusions and hallucinations.  5. Will continue the medication Benadryl at 50 mgs po qhs for sleep. 6. Will continue the medication Xanax 0.5 mgs po TID - prn for anxiety. 7. Will continue Trazodone 100 mgs po qhs - prn for sleep. 8. Will continue to monitor.  9. Laboratory studies reviewed.  10. Will continue to monitor.  Cassie Ruiz 12/04/2011, 4:27 PM

## 2011-12-04 NOTE — Discharge Planning (Signed)
Met with patient in Aftercare Planning Group.   She states that she has decided to go home, and agrees to do some journaling prior to discharge that she will be able to look back at if she starts to feel paranoid again about her husband and his co-workers trying to kill her.  Patient enjoyed the [facility] meeting yesterday, and Case Manager urged her to think about driving at least one day a week to attend their class.  She stated this is not possible as it is an hour away and she does not like driving in [place].  She asked for a map from [facility] where her car is parked to [facility] where she lives.    Case Manager provided this.  Utilization review was done for additional days.  Ambrose Mantle, LCSW 12/04/2011, 1:59 PM

## 2011-12-04 NOTE — Progress Notes (Signed)
[facility] Group Notes:  (Counselor/Nursing/MHT/Case Management/Adjunct)  12/04/2011 8:24 AM  Type of Therapy:  Group Therapy 12/02/11  Participation Level:  Active  Participation Quality:  Attentive and Sharing  Affect:  Depressed  Cognitive:  Oriented  Insight:  Limited  Engagement in Group:  Good  Engagement in Therapy:  Good  Modes of Intervention:  Clarification, Education, Problem-solving and Support  Summary of Progress/Problems: Patient has not been in group the last several times. When questioned about this she stated she had been sleeping because she had so much trouble during the night. She was feeling a little bit better because she had been able to sleep 4 un-interrupted hours of sleep. Talked about how this was effecting her.   Keyleen Cerrato, [name] 12/04/2011, 8:24 AM

## 2011-12-05 LAB — VITAMIN D 25 HYDROXY (VIT D DEFICIENCY, FRACTURES): Vit D, 25-Hydroxy: 32 ng/mL (ref 30–89)

## 2011-12-05 MED ORDER — VITAMIN D (ERGOCALCIFEROL) 1.25 MG (50000 UNIT) PO CAPS
50000.0000 [IU] | ORAL_CAPSULE | ORAL | Status: DC
  Administered 2011-12-05: 50000 [IU] via ORAL
  Filled 2011-12-05 (×2): qty 1

## 2011-12-05 NOTE — Progress Notes (Signed)
[facility] Group Notes:  (Counselor/Nursing/MHT/Case Management/Adjunct)  12/05/2011 2:43 PM  Type of Therapy:  Group Therapy  Participation Level:  Active  Participation Quality:  Appropriate  Affect:  Blunted  Cognitive:  Oriented  Insight:  Good  Engagement in Group:  Good  Engagement in Therapy:  Good  Modes of Intervention:  Education, Problem-solving and Support  Summary of Progress/Problems: Patient expressed some disappointment at not being able to leave on Saturday but understands the reasons. She stated that one of the reasons she wanted to go home was because she wanted to get outside. Talked about being bored when there aren't groups. She was able to identify some activities she might be able to do over the weekend. She was supportive of other peers and gave appropriate feedback.   HartisAram Ruiz 12/05/2011, 2:43 PM

## 2011-12-05 NOTE — Progress Notes (Addendum)
Cassie Ruiz is seen out in the milieu this morning. She is  Quiet. Reserved. Makes very brief eye contact. A Takes her meds as ordered. States (  On her self inventory) that she has " off and on" SI, and rates her depression and hopelessness " 7 / 7 ". Pt verbally but coyly contracts with this nurse to stay safe. R Safety is in place and therapeutic relationship is  Cassie PD RN [facility]

## 2011-12-05 NOTE — Progress Notes (Signed)
[facility] Group Notes:  (Counselor/Nursing/MHT/Case Management/Adjunct)  12/05/2011 8:24 AM  Type of Therapy:  Group Therapy 12/04/11 9:30, Music Therapy 1:15  Participation Level:  Active  Participation Quality:  Attentive and Sharing  Affect:  Blunted  Cognitive:  Oriented  Insight:  Good  Engagement in Group:  Good  Engagement in Therapy:  Good  Modes of Intervention:  Activity, Clarification, Education, Problem-solving and Support, Music  Summary of Progress/Problems: Patient talked in group about how influenced she had been by the speaker from mental health association. She stated that she really related to his story and that he takes the same medications that she is on. She stated that he gave her hope. Patient was active in music therapy activities and appeared to relax a little more than usual. Her brighter affect was brought to her attention. She reported less anxiety.   Nyaira Hodgens, [name] 12/05/2011, 8:24 AM

## 2011-12-05 NOTE — Progress Notes (Signed)
[facility] MD Progress Note  12/05/2011 4:32 PM Hospital Day # 10 Diagnosis:  Axis I: Schizophrenia - Paranoid Type.   The patient was seen today and reports the following:   ADL's: Intact.  Sleep: The patient reports to sleeping good last night. Appetite: The patient reports a good appetite today.   Mild>(1-10) >Severe  Hopelessness (1-10): 5  Depression (1-10): 7  Anxiety (1-10): 5  Suicidal Ideation: The patient reports "a few suicidal ideations today" but with no plan or intent. She reports a 75% decrease in thoughts since her arrival. Plan: No  Intent: No  Means: No  Homicidal Ideation: The patient adamantly denies any homicidal ideations today.  Plan: No  Intent: No.  Means: No   General Appearance/Behavior: The patient remains cooperative today with this provider. She is brighter and more positive. Eye Contact: Good.  Speech: Appropriate in rhythm, rate, and volume. Motor Behavior: wnl.  Level of Consciousness: Alert  Mental Status: Oriented x 3.  Mood: Moderately depressed.  Affect: Moderately constricted today.  Anxiety Level: Moderate anxiety reported today.  Thought Process: Paranoid thinking remains but much improved.  Thought Content: The patient denies any auditory or visual hallucinations today.  She reports continuing paranoid delusions which are much improved.  Perception: Paranoid thinking remains but much improved.  Judgment: Fair.  Insight: Fair.  Cognition: Oriented to person, place and time.  Sleep:  Number of Hours: 5.25    Vital Signs:Blood pressure 92/59, pulse 85, temperature 98.2 F (36.8 C), temperature source Oral, resp. rate 12, height 5' 3.39" (1.61 m), weight 84.369 kg (186 lb), last menstrual period 09/24/2011.  Current Medications: Current Facility-Administered Medications  Medication Dose Route Frequency Provider Last Rate Last Dose  . acetaminophen (TYLENOL) tablet 650 mg  650 mg Oral Q6H PRN Verne Spurr, PA-C   650 mg at 12/04/11 1704    . ALPRAZolam Prudy Feeler) tablet 0.5 mg  0.5 mg Oral TID PRN Curlene Labrum Readling, MD   0.5 mg at 12/04/11 2201  . alum & mag hydroxide-simeth (MAALOX/MYLANTA) 200-200-20 MG/5ML suspension 30 mL  30 mL Oral Q4H PRN Verne Spurr, PA-C      . diphenhydrAMINE (BENADRYL) capsule 50 mg  50 mg Oral Q2200 Curlene Labrum Readling, MD   50 mg at 12/04/11 2201  . ibuprofen (ADVIL,MOTRIN) tablet 800 mg  800 mg Oral Q6H PRN Verne Spurr, PA-C   800 mg at 12/05/11 1203  . levothyroxine (SYNTHROID, LEVOTHROID) tablet 75 mcg  75 mcg Oral Daily Verne Spurr, PA-C   75 mcg at 12/05/11 [phone]  . magnesium hydroxide (MILK OF MAGNESIA) suspension 30 mL  30 mL Oral Daily PRN Verne Spurr, PA-C      . paliperidone (INVEGA) 24 hr tablet 9 mg  9 mg Oral QHS Verne Spurr, PA-C   9 mg at 12/04/11 2201  . sertraline (ZOLOFT) tablet 100 mg  100 mg Oral QHS Verne Spurr, PA-C   100 mg at 12/04/11 2201  . traMADol (ULTRAM) tablet 100 mg  100 mg Oral Q12H PRN Curlene Labrum Readling, MD   100 mg at 12/05/11 0830  . traZODone (DESYREL) tablet 100 mg  100 mg Oral QHS PRN Viviann Spare, FNP   100 mg at 12/04/11 2300   Lab Results:  Results for orders placed during the hospital encounter of 11/25/11 (from the past 48 hour(s))  VITAMIN D 25 HYDROXY     Status: Normal   Collection Time   12/04/11  7:36 PM      Component  Value Range Comment   Vit D, 25-Hydroxy 32  30 - 89 (ng/mL)     Time was spent today discussing with the patient her current symptoms.  The patient states that she is sleeping well at night and reports a good appetite today. Zoraida reports that she is doing a little better and anticipates going home on Monday.  She has noted a 75% reduction in her symptoms since her arrival and has reported no further side effects to her medication.  She plans to have her husband come to [facility] to pick her up and follow her home in her car.   Treatment Plan Summary:  1. Daily contact with patient to assess and evaluate symptoms and progress in  treatment  2. Medication management  3. The patient will deny suicidal ideations or homicidal ideations for 48 hours prior to discharge and have a depression and anxiety rating of 3 or less. The patient will also deny any auditory or visual hallucinations or delusional thinking.  4. The patient will deny any symptoms of substance withdrawal at time of discharge.  Plan:  1. Will start Ergocalciferol for her Vitamin D supplementation. 2. Continue current medical regimen as written to psych meds at this time. 3. Laboratory studies reviewed.  4. Will continue to monitor.  Rona Ravens. Maralyn Witherell PAC 12/05/2011, 4:32 PM

## 2011-12-05 NOTE — Discharge Planning (Signed)
Met with patient in Aftercare Planning Group.   She states now that she is not sure if she will ask her husband to come into the hospital to pick her up at discharge.  She thinks that she will use the map given to her, instead.  Patient continues to have intermittent suicidal ideation.  Her affect is flat, as has been observed throughout each hospitalization at [facility].  Case Manager discussed with patient the idea of going to a Partial Hospitalization Program or Intensive Outpatient Program.  She was resistant to the idea of returning to this hospital for such a program due to the distance from her home, but was open to the idea of a stepdown program.  Case Manager is in the process of looking for such a program closer to the patient's home.  Patient does want to go home tomorrow, but remains unable to go 24-48 hours without suicidal ideation, so Treatment Team discussed this and decided patient should stay in the hospital over the weekend.  Ambrose Mantle, LCSW 12/05/2011, 11:22 AM

## 2011-12-05 NOTE — Progress Notes (Signed)
12/05/2011         Time: 0930      Group Topic/Focus: The focus of the group is on enhancing the patients' ability to cope with stressors by understanding what coping is, why it is important, the negative effects of stress and developing healthier coping skills. Patients practice [place] and discuss how exercise can be used as a healthy coping strategy.  Participation Level: Active  Participation Quality: Attentive  Affect: Blunted  Cognitive: Oriented   Additional Comments: None.   Illa Enlow 12/05/2011 12:24 PM

## 2011-12-06 NOTE — Progress Notes (Signed)
Pt did not attend group this morning as she reported she was still very sleepy   She reports being happy because she will be discharged soon  She still appears depressed at times and does endorse suicidal ideation with no plan and contracts for safety at present  She interacts well with others and is calm and cooperative  Verbal support given  Medications administered and effectiveness monitored  Q 15 min checks  Pt safe at present

## 2011-12-06 NOTE — Progress Notes (Signed)
  Cassie Ruiz is a 46 y.o. female [id number] 1966-04-02  11/25/2011 Principal Problem:  *Schizophrenia, paranoid type   Mental Status: Alert & oriented mood is calm affect is slightly sleepy. Denies SI/HI/AVH.    Subjective/Objective: Meds keep her sleepy - seen in room easily engages in conversation. Denies a need to adjust or change meds denies paranoia and anticipates discharge Monday.   Filed Vitals:   12/06/11 0845  BP: 108/66  Pulse: 80  Temp:   Resp:     Lab Results:   BMET    Component Value Date/Time   NA 139 11/28/2011 0623   K 3.9 11/28/2011 0623   CL 107 11/28/2011 0623   CO2 25 11/28/2011 0623   GLUCOSE 96 11/28/2011 0623   BUN 12 11/28/2011 0623   CREATININE 0.60 11/28/2011 0623   CALCIUM 8.5 11/28/2011 0623   GFRNONAA >90 11/28/2011 0623   GFRAA >90 11/28/2011 4098    Medications:  Scheduled:     . diphenhydrAMINE  50 mg Oral Q2200  . levothyroxine  75 mcg Oral Daily  . paliperidone  9 mg Oral QHS  . sertraline  100 mg Oral QHS  . Vitamin D (Ergocalciferol)  50,000 Units Oral Q7 days     PRN Meds acetaminophen, ALPRAZolam, alum & mag hydroxide-simeth, ibuprofen, magnesium hydroxide, traMADol, traZODone  Plan: continue current plan of care  Salome Hautala,MICKIE D. 12/06/2011

## 2011-12-06 NOTE — Progress Notes (Signed)
Patient ID: Cassie Ruiz, female   DOB: October 14, 1965, 46 y.o.   MRN: [id number]  [facility] Group Notes:  (Counselor/Nursing/MHT/Case Management/Adjunct)  12/06/2011 11 AM  Type of Therapy:  Aftercare Planning, Group Therapy, Dance/Movement Therapy   Participation Level:  Did Not Attend     [facility]

## 2011-12-06 NOTE — Progress Notes (Signed)
Patient ID: Cassie Ruiz, female   DOB: 04-16-1966, 46 y.o.   MRN: [id number] The patient had a neutral mood and affect. Pleasant and appropriate. Stated that she sometimes still gets anxious at bedtime, thinking that someone is out to get her. She is aware that this is irational thinking, but she still gets frightened. Looking forward to going home next week, but is still concerned that her thoughts aren't right. Denies any suicidal ideation at present.

## 2011-12-06 NOTE — Progress Notes (Signed)
Pt was calm and cooperative this evening. Pt attended group this evening and was observed interacting well with others such as her roommate. Pt showed awareness of her pharmacological regiment. Pt reported having a terrible dream Thursday night that involved a deer being trapped within her home. Pt complained of an headache and some back pain that was relieved with ultram. Pt also had a prn for her anxiety at bedtime. Continued support and availability as needed was extended to this pt. Pt remains safe with q62min checks.

## 2011-12-07 MED ORDER — PALIPERIDONE ER 6 MG PO TB24
9.0000 mg | ORAL_TABLET | Freq: Every day | ORAL | Status: DC
  Administered 2011-12-07 – 2011-12-08 (×2): 9 mg via ORAL
  Filled 2011-12-07: qty 1
  Filled 2011-12-07: qty 2
  Filled 2011-12-07 (×2): qty 1
  Filled 2011-12-07: qty 2
  Filled 2011-12-07: qty 1

## 2011-12-07 NOTE — Progress Notes (Signed)
  Zaylee Cornia is a 46 y.o. female [id number] [birth year]  11/25/2011 Principal Problem:  *Schizophrenia, paranoid type   Mental Status: Mood is allright denies SI/HI/AVH.    Subjective/Objective: Asks to decrease Invega to hopefully decrease daytime sleepiness.    Filed Vitals:   12/07/11 0701  BP: 97/62  Pulse: 86  Temp:   Resp:     Lab Results:   BMET    Component Value Date/Time   NA 139 11/28/2011 0623   K 3.9 11/28/2011 0623   CL 107 11/28/2011 0623   CO2 25 11/28/2011 0623   GLUCOSE 96 11/28/2011 0623   BUN 12 11/28/2011 0623   CREATININE 0.60 11/28/2011 0623   CALCIUM 8.5 11/28/2011 0623   GFRNONAA >90 11/28/2011 0623   GFRAA >90 11/28/2011 4696    Medications:  Scheduled:     . diphenhydrAMINE  50 mg Oral Q2200  . levothyroxine  75 mcg Oral Daily  . paliperidone  9 mg Oral QHS  . sertraline  100 mg Oral QHS  . Vitamin D (Ergocalciferol)  50,000 Units Oral Q7 days     PRN Meds acetaminophen, ALPRAZolam, alum & mag hydroxide-simeth, ibuprofen, magnesium hydroxide, traMADol, traZODone Plan: will change time of administration to 6pm   Malissia Rabbani,MICKIE D. 12/07/2011

## 2011-12-07 NOTE — Progress Notes (Signed)
Patient ID: Cassie Ruiz, female   DOB: 1966-02-22, 45 y.o.   MRN: [id number]   [facility] Group Notes:  (Counselor/Nursing/MHT/Case Management/Adjunct)  12/07/2011 11 AM  Type of Therapy:  Aftercare Planning, Group Therapy, Dance/Movement Therapy   Participation Level:  Active  Participation Quality:  Appropriate and Attentive  Affect:  Appropriate  Cognitive:  Appropriate  Insight:  Good  Engagement in Group:  Good  Engagement in Therapy:  Good  Modes of Intervention:  Clarification, Problem-solving, Role-play, Socialization and Support  Summary of Progress/Problems: After Care: Pt. attended and participated in aftercare planning group. Pt. accepted information on suicide prevention, warning signs to look for with suicide and crisis line numbers to use. Therapist invited patients to describe their current mood in terms of a weather forecast. Pt. shared that she was feeling "stormy." Pt. reported that she will be returning home after discharge but did not seem interested in any alternative treatment programs outside of the hospital  Counseling: Therapists discussed support and invited patients to share positive and negative supports as well as supports that can be both. Patients imagined tools that could help them overcome negative supports and were able to share how they could identify when a support was unhealthy. Cassie Ruiz shared that emotional stability is a positive support and that distractions can be a positive as well as a negative support.     Gevena Mart

## 2011-12-07 NOTE — Progress Notes (Signed)
Pt is pleasant on approach and cooperative  She attended group and participated appropriately   She continues to ask for pain medication as often as possible and expresses doubt of being ready for discharge tomorrow  She is care taking her roommate frequently  Verbal support given  Medications administered and effectiveness monitored  Q 15 min checks  Pt safe at present

## 2011-12-08 MED ORDER — SERTRALINE HCL 50 MG PO TABS
150.0000 mg | ORAL_TABLET | Freq: Every day | ORAL | Status: DC
  Administered 2011-12-08: 150 mg via ORAL
  Filled 2011-12-08 (×2): qty 3
  Filled 2011-12-08: qty 9

## 2011-12-08 NOTE — Progress Notes (Signed)
[facility] MD Progress Note  12/08/2011 3:56 PM 13 Diagnosis:  Axis I: Schizophrenia - Paranoid Type.   The patient was seen today and reports the following:   ADL's: Intact.  Sleep: The patient reports to sleeping good last night. Appetite: The patient reports a good appetite today.   Mild>(1-10) >Severe  Hopelessness (1-10): 2-3 Depression (1-10): 7  Anxiety (1-10): 7  Suicidal Ideation: The patient reports "a few suicidal ideations today" but with no plan or intent. She reports a 95% decrease in thoughts since her arrival. Plan: No  Intent: No  Means: No  Homicidal Ideation: The patient adamantly denies any homicidal ideations today.  Plan: No  Intent: No.  Means: No   General Appearance/Behavior: The patient is seen in treatment team today with a goal of discharge planned.  She is cooperative and informative. Eye Contact: Good.  Speech: Clear and goal directed. Motor Behavior: wnl.  Level of Consciousness: Alert  Mental Status: Oriented x 3.  Mood: Moderately depressed.  Affect: Moderately constricted today.  Anxiety Level: Moderate anxiety reported today.  Thought Process: Paranoid thinking remains but much improved. States that she is anxious about going home. Thought Content: The patient denies any auditory or visual hallucinations today.  She reports continuing paranoid delusions which are much improved.  Perception: Paranoid thinking remains but much improved.  Judgment: Fair.  Insight: Fair.  Cognition: Oriented to person, place and time.  Sleep:  Number of Hours: 6.75    Vital Signs:Blood pressure 92/59, pulse 85, temperature 98.2 F (36.8 C), temperature source Oral, resp. rate 12, height 5' 3.39" (1.61 m), weight 84.369 kg (186 lb), last menstrual period 09/24/2011.  Current Medications: Current Facility-Administered Medications  Medication Dose Route Frequency Provider Last Rate Last Dose  . acetaminophen (TYLENOL) tablet 650 mg  650 mg Oral Q6H PRN Verne Spurr, PA-C   650 mg at 12/04/11 1704  . ALPRAZolam Prudy Feeler) tablet 0.5 mg  0.5 mg Oral TID PRN Curlene Labrum Readling, MD   0.5 mg at 12/04/11 2201  . alum & mag hydroxide-simeth (MAALOX/MYLANTA) 200-200-20 MG/5ML suspension 30 mL  30 mL Oral Q4H PRN Verne Spurr, PA-C      . diphenhydrAMINE (BENADRYL) capsule 50 mg  50 mg Oral Q2200 Curlene Labrum Readling, MD   50 mg at 12/04/11 2201  . ibuprofen (ADVIL,MOTRIN) tablet 800 mg  800 mg Oral Q6H PRN Verne Spurr, PA-C   800 mg at 12/05/11 1203  . levothyroxine (SYNTHROID, LEVOTHROID) tablet 75 mcg  75 mcg Oral Daily Verne Spurr, PA-C   75 mcg at 12/05/11 [phone]  . magnesium hydroxide (MILK OF MAGNESIA) suspension 30 mL  30 mL Oral Daily PRN Verne Spurr, PA-C      . paliperidone (INVEGA) 24 hr tablet 9 mg  9 mg Oral QHS Verne Spurr, PA-C   9 mg at 12/04/11 2201  . sertraline (ZOLOFT) tablet 100 mg  100 mg Oral QHS Verne Spurr, PA-C   100 mg at 12/04/11 2201  . traMADol (ULTRAM) tablet 100 mg  100 mg Oral Q12H PRN Curlene Labrum Readling, MD   100 mg at 12/05/11 0830  . traZODone (DESYREL) tablet 100 mg  100 mg Oral QHS PRN Viviann Spare, FNP   100 mg at 12/04/11 2300   Lab Results:  Results for orders placed during the hospital encounter of 11/25/11 (from the past 48 hour(s))  VITAMIN D 25 HYDROXY     Status: Normal   Collection Time   12/04/11  7:36 PM  Component Value Range Comment   Vit D, 25-Hydroxy 32  30 - 89 (ng/mL)     Time was spent today discussing with the patient her current symptoms.  Due to Jaretzi's rating of 7 on depression and anxiety, she is offered another day in the hospital in an effort to increase her sertraline to 150mg .  This is to decrease her anxiety and her depression.  She is covered by insurance and seems very relieved not to have to leave the hospital today.  Treatment Plan Summary:  1. Daily contact with patient to assess and evaluate symptoms and progress in treatment  2. Medication management  3. The patient will  deny suicidal ideations or homicidal ideations for 48 hours prior to discharge and have a depression and anxiety rating of 3 or less. The patient will also deny any auditory or visual hallucinations or delusional thinking.  4. The patient will deny any symptoms of substance withdrawal at time of discharge.  Plan:  1. Sertraline to be increased to 150mg  po at hs. 2. Remainder of medications will be continued. 3. Laboratory studies reviewed.  4. Will continue to monitor.  Rona Ravens. Amandalynn Pitz PAC 12/08/2011, 3:56 PM

## 2011-12-08 NOTE — Tx Team (Signed)
Interdisciplinary Treatment Plan Update (Adult)  Date:  12/08/2011  Time Reviewed:  10:15AM-11:15AM  Progress in Treatment: Attending groups:  Yes Participating in groups:    Yes, fully engaged Taking medication as prescribed:    Yes Tolerating medication:   Yes, no side effects Family/Significant other contact made:  Yes Patient understands diagnosis:   Yes Discussing patient identified problems/goals with staff:   Yes Medical problems stabilized or resolved:   Yes Denies suicidal/homicidal ideation:  Yes Issues/concerns per patient self-inventory:   None Other:    New problem(s) identified: No, Describe:    Reason for Continuation of Hospitalization: Depression Anxiety Medication stabilization  Interventions implemented related to continuation of hospitalization:  Medication monitoring and adjustment, safety checks Q15 min., suicide risk assessment, group therapy, psychoeducation, collateral contact, aftercare planning, ongoing physician assessments, medication education   Additional comments:  Not applicable  Estimated length of stay:  1-2 days  Discharge Plan:  Return home with husband, follow up with [facility].  They have not yet called with a follow-up appointment, so patient gave Case Manager a telephone number to call with the appointment date/time.  Case Manager has looked into whether there is an Intensive Outpatient Program nearby, but was unable to locate one.    New goal(s):  Not applicable  Review of initial/current patient goals per problem list:   1.  Goal(s):  Reduce threatening voices (auditory hallucinations) to baseline.  Met:  Yes  Target date:  By Discharge   As evidenced by:  Patient does not hear voices currently.  2.  Goal(s):  Return sleep to normal pattern of 6+ hours.  Met:  Yes  Target date:  By Discharge  As evidenced by:  "sleeping well"   3.  Goal(s):  Reduce anxiety from admission level to no greater than 3  at discharge.  Met:  No  Target date:  By Discharge   As evidenced by:  "7" today - patient states that this is as good as it gets but treatment team feel that anxiety should be lower prior to D/C.  4.  Goal(s):  Reduce depression and hopelessness from admission level to no greater than 3 at discharge.  Met:  No  Target date:  By Discharge   As evidenced by:  Patient's depression is "7" today, and she feels hopeless that it could be better.  She agrees that her Zoloft can be increased to improve her depressive smptoms.  5. Goal(s): Deny SI for 48 hours prior to D/C.  Met: Yes Target date: By Discharge  As evidenced by: No SI for several days  6. Goal(s): Reduce paranoia to baseline.  Met: Yes Target date: By Discharge  As evidenced by: Seems to be resolved  7. Goal(s): Medication stabilization  Met: Yes Target date: By Discharge  As evidenced by: Stable on current meds  Attendees: Patient:  Cassie Ruiz  12/08/2011 10:15AM-11:15AM  Family:     Physician:     Nursing:   Neill Loft, RN 12/08/2011 10:15AM -11:15AM   Case Manager:  Ambrose Mantle, LCSW 12/08/2011 10:15AM-11:15AM  Counselor:  Veto Kemps, MT-BC 12/08/2011 10:15AM-11:15AM  Other:   Verne Spurr, PA 12/08/2011 10:15AM-11:15AM  Other:      Other:      Other:       Scribe for Treatment Team:   Sarina Ser, 12/08/2011, 10:15AM-11:15AM

## 2011-12-08 NOTE — Progress Notes (Signed)
In hallway on approach. Appears flat and depressed. Calm and cooperative with assessment. No acute distress noted. States she had a good day. Was looking forward to D/C but understands that they kept her for a little while longer based on her C/O increased Depression. Earlier in the day she rated her Depression at a 7, but now states it is a 5. Support and encouragement provided. Discussed scheduling of her Hinda Glatter which was moved to 1800 for administration. She states the practitioner misunderstood her when she asked for the dose to be reduced to 6mg  r/t feeling drowsy all the time. She feels practitioner took it as her requesting for it to be moved to 1800. She is concerned about taking it too early and asked for writer to wait until 2100 for administration. Encouraged to F/U with MD in AM about dosage and timing. Currently denies any paranoid delusions. Denies SI/HI/AVH and contracts for safety. Offered no additional questions or concerns. POC and medications for the shift reviewed and understanding verbalized. Safety has been maintained with Q15 minute observation. Will continue current POC.

## 2011-12-08 NOTE — Progress Notes (Addendum)
Pt continues to request medications often. Had complained of daytime sleepiness and therefore invega was moved to 1800. Day shift RN eviewed with pt rationale however pt refused and requested it be given at 2030. Took without difficulty. Required xanax and later trazadone and ultram to initiate sleep. She contracts for safety and endorses [facility]. "That's an everyday thing" however pt not does not appear to respond to internal stimuli. Presently resting in bed. Cassie Ruiz

## 2011-12-08 NOTE — Progress Notes (Signed)
[facility] Group Notes:  (Counselor/Nursing/MHT/Case Management/Adjunct)  12/08/2011 1:18 PM  Type of Therapy:  Group Therapy  Participation Level:  Active  Participation Quality:  Appropriate  Affect:  Depressed  Cognitive:  Appropriate  Insight:  Limited  Engagement in Group:  Good  Engagement in Therapy:  Good  Modes of Intervention:  Education, Problem-solving and Support  Summary of Progress/Problems: Patient stated that she is looking forward to discharge. Continues to have some anxiety but is trusting of husband and his family. Identified what she would do to cope with anxiety including using her professional support system.    Kadyn Guild, [name] 12/08/2011, 1:18 PM
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
## 2011-12-08 NOTE — Progress Notes (Signed)
Patient ID: Cassie Ruiz, female   DOB: April 09, 1966, 46 y.o.   MRN: [id number] Pt came to treatment  Team to discuss discharge.  She denies SI/HI, but still rates her depression a 7.  When asked about readiness for d/C, pt said "I'm on the line"  She feels her depression is much improved but she still ha concerns about being alone at night and named this her biggest stressor.  Pt agreed an increase in her zoloft would be helpful and she plans to try to exercise more today and attend groups.  Pt seemed relieved when discharge was postponed to tomorrow.

## 2011-12-09 MED ORDER — TRAMADOL HCL ER 200 MG PO TB24: 200.0000 mg | ORAL_TABLET | Freq: Two times a day (BID) | ORAL | Status: DC

## 2011-12-09 MED ORDER — TRAMADOL HCL 50 MG PO TABS: 100.0000 mg | ORAL_TABLET | Freq: Two times a day (BID) | ORAL | Status: DC | PRN

## 2011-12-09 MED ORDER — PALIPERIDONE ER 9 MG PO TB24: 9.0000 mg | ORAL_TABLET | Freq: Every day | ORAL | Status: DC

## 2011-12-09 MED ORDER — DIPHENHYDRAMINE HCL 50 MG PO CAPS: ORAL_CAPSULE | ORAL | Status: DC

## 2011-12-09 MED ORDER — LEVOTHYROXINE SODIUM 75 MCG PO TABS: 75.0000 ug | ORAL_TABLET | Freq: Every day | ORAL | Status: DC

## 2011-12-09 MED ORDER — SERTRALINE HCL 50 MG PO TABS: 150.0000 mg | ORAL_TABLET | Freq: Every day | ORAL | Status: DC

## 2011-12-09 MED ORDER — VITAMIN D (ERGOCALCIFEROL) 1.25 MG (50000 UNIT) PO CAPS: 50000.0000 [IU] | ORAL_CAPSULE | ORAL | Status: DC

## 2011-12-09 MED ORDER — TRAMADOL HCL 50 MG PO TABS: 100.0000 mg | ORAL_TABLET | Freq: Two times a day (BID) | ORAL | Status: AC | PRN

## 2011-12-09 MED ORDER — ALPRAZOLAM 0.5 MG PO TABS: ORAL_TABLET | ORAL | Status: DC

## 2011-12-09 MED ORDER — PALIPERIDONE ER 3 MG PO TB24
9.0000 mg | ORAL_TABLET | Freq: Every day | ORAL | Status: DC
  Filled 2011-12-09: qty 9

## 2011-12-09 MED ORDER — TRAZODONE HCL 100 MG PO TABS: ORAL_TABLET | ORAL | Status: DC
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
## 2011-12-09 NOTE — Tx Team (Signed)
Interdisciplinary Treatment Plan Update (Adult)  Date:  12/09/2011  Time Reviewed: 10:15AM-11:15AM   Progress in Treatment:  Attending groups: Yes  Participating in groups: Yes, fully engaged  Taking medication as prescribed: Yes  Tolerating medication: Yes, no side effects  Family/Significant other contact made: Yes  Patient understands diagnosis: Yes  Discussing patient identified problems/goals with staff: Yes  Medical problems stabilized or resolved: Yes  Denies suicidal/homicidal ideation: Yes  Issues/concerns per patient self-inventory: None  Other:   New problem(s) identified: No, Describe:   Reason for Continuation of Hospitalization:  None  Interventions implemented related to continuation of hospitalization: Medication monitoring and adjustment, safety checks Q15 min., suicide risk assessment, group therapy, psychoeducation, collateral contact, aftercare planning, ongoing physician assessments, medication education - UNTIL DISCHARGE  Additional comments: Not applicable   Estimated length of stay: Discharge today  Discharge Plan: Return home with husband, follow up with [facility]. They have not yet called with a follow-up appointment, so patient gave Case Manager a telephone number to call with the appointment date/time. Case Manager has looked into whether there is an Intensive Outpatient Program nearby, but was unable to locate one.   New goal(s): Not applicable   Review of initial/current patient goals per problem list:  1. Goal(s): Reduce threatening voices (auditory hallucinations) to baseline.  Met: Yes  Target date: By Discharge  As evidenced by: Patient does not hear voices currently.  2. Goal(s): Return sleep to normal pattern of 6+ hours.  Met: Yes  Target date: By Discharge  As evidenced by: "sleeping well"   3. Goal(s): Reduce anxiety from admission level to no greater than 3 at discharge.  Met: No  Target date: By Discharge    As evidenced by:  "5" today  4. Goal(s): Reduce depression and hopelessness from admission level to no greater than 3 at discharge.  Met: No  Target date: By Discharge  As evidenced by: "5" for both today  5. Goal(s): Deny SI for 48 hours prior to D/C.  Met: Yes  Target date: By Discharge  As evidenced by: No SI for several days   6. Goal(s): Reduce paranoia to baseline.  Met: Yes  Target date: By Discharge  As evidenced by: Seems to be resolved  7. Goal(s): Medication stabilization  Met: Yes  Target date: By Discharge  As evidenced by: Stable on current meds   Attendees: Patient:  Cassie Ruiz  12/09/2011 10:15AM-11:15AM  Family:     Physician:  Dr. Harvie Heck Readling 12/09/2011 10:15AM-11:15AM  Nursing:   Neill Loft, RN 12/09/2011 10:15AM -11:15AM   Case Manager:  Ambrose Mantle, LCSW 12/09/2011 10:15AM-11:15AM  Counselor:  Veto Kemps, MT-BC 12/09/2011 10:15AM-11:15AM  Other:   Verne Spurr, PA 12/09/2011 10:15AM-11:15AM  Other:      Other:      Other:       Scribe for Treatment Team:   Sarina Ser, 12/09/2011, 10:15AM-11:15AM

## 2011-12-09 NOTE — Progress Notes (Addendum)
[facility] Case Management Discharge Plan:    Will you be returning to the same living situation after discharge: Yes,  with husband At discharge, do you have transportation home?:Yes,  security will be asked to take her to her car at [facility], then she has been given a map to get home Do you have the ability to pay for your medications:Yes,  insurance and income  Interagency Information:     Release of information consent forms completed and in the chart;  Patient's signature needed at discharge.  Patient to Follow up at:  Follow-up Information    Schedule an appointment as soon as possible for a visit with [facility]. (Case Manager will contact you with an appointment date and time ASAP)    Contact information:   [address] [place] [address] Tel: [phone] Fax: [phone]           Patient denies SI/HI:   Yes,      Safety Planning and Suicide Prevention discussed:  Yes,  During Aftercare Planning Groups, Case Manager provided psychoeducation on "Suicide Prevention Information."  This included descriptions of risk factors for suicide, warning signs that an individual is in crisis and thinking of suicide, and what to do if this occurs.  Pt indicated understanding of information provided, and will read brochure given upon discharge.     Barrier to discharge identified:No.  Summary and Recommendations:  It is recommended that patient go to a step-down program; however, one could not be located.  Thus she will continue to follow up with her provide [facility].   Sarina Ser 12/09/2011, 12:58 PM  Follow-up With Details Comments Contact Info Ms. Roe Rutherford, NP Schedule an appointment as soon as possible for a visit on 12/09/2011 6:00PM appointment. You have a $30 co-pay. Also, be aware that if you have any other No-Shows, you will not be able to return to that practice, as their policy only allows 3 No-Shows. The fee owed for those  past No-Shows is $80. [facility] [address] [place] [address] Tel: [phone] Fax: [phone]

## 2011-12-09 NOTE — Progress Notes (Signed)
Patient ID: Cassie Ruiz, female   DOB: 1965-08-26, 46 y.o.   MRN: [id number] Pt was discharged ambulatory to be driven by security to [facility] tp pick up her car to drive herself home.  She denies SI/HI.  She verbalizes understanding of her d/c meds and followup. She was given sample meds and scripts by MD.  She is hopeful about the future and expressed appreciation for care given.  She was given a suicide risk pamphlet and knows to reach out for help if needed.

## 2011-12-09 NOTE — Progress Notes (Signed)
[facility] Adult Inpatient Family/Significant Other Suicide Prevention Education  Suicide Prevention Education:  Education Completed; Cassie Ruiz [phone]) has been identified by the patient as the family member/significant other with whom the patient will be residing, and identified as the person(s) who will aid the patient in the event of a mental health crisis (suicidal ideations/suicide attempt).  With written consent from the patient, the family member/significant other has been provided the following suicide prevention education, prior to the and/or following the discharge of the patient.  The suicide prevention education provided includes the following:  Suicide risk factors  Suicide prevention and interventions  National Suicide Hotline telephone number  [employer] assessment telephone number  [facility] Emergency Assistance 911  [facility] and/or Residential Mobile Crisis Unit telephone number  Request made of family/significant other to:  Remove weapons (e.g., guns, rifles, knives), all items previously/currently identified as safety concern.    Remove drugs/medications (over-the-counter, prescriptions, illicit drugs), all items previously/currently identified as a safety concern.  The family member/significant other verbalizes understanding of the suicide prevention education information provided.  The family member/significant other agrees to remove the items of safety concern listed above. Patient is no longer paranoid about husband. Husband feels comfortable with her returning home.  HartisAram Ruiz 12/09/2011, 3:55 PM
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
## 2011-12-09 NOTE — Discharge Planning (Signed)
Patient made it known she did not have the $30 co-pay to see her provider Ms. Transue today at 6pm.  The only other time that had been offered for this appointment was 01/05/12.  Case Manager called back to [facility] and talked with Ms. Transue herself, who authorized the patient being scheduled for a work-in appointment on 12/25/11 at 12:00pm.  This was conveyed to the patient.  Ambrose Mantle, LCSW 12/09/2011, 3:26 PM
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
## 2011-12-11 NOTE — Progress Notes (Signed)
Patient Discharge Instructions:  After Visit Summary (AVS):   Faxed to:  12/10/2011 Psychiatric Admission Assessment Note:   Faxed to:  12/10/2011 Suicide Risk Assessment - Discharge Assessment:   Faxed to:  12/10/2011 Faxed/Sent to the Next Level Care provider:  12/10/2011  Faxed to [facility] - Ms. Roe Rutherford, NP @ [phone]  Wandra Scot, 12/11/2011, 5:41 PM

## 2013-04-29 DIAGNOSIS — F329 Major depressive disorder, single episode, unspecified: Secondary | ICD-10-CM | POA: Insufficient documentation

## 2013-07-14 HISTORY — PX: CHOLECYSTECTOMY: SHX55

## 2014-04-11 ENCOUNTER — Ambulatory Visit: Payer: Self-pay | Admitting: Otolaryngology

## 2014-04-14 DIAGNOSIS — F419 Anxiety disorder, unspecified: Secondary | ICD-10-CM | POA: Insufficient documentation

## 2015-11-07 ENCOUNTER — Other Ambulatory Visit (HOSPITAL_COMMUNITY): Payer: Self-pay | Admitting: Otolaryngology

## 2015-11-07 DIAGNOSIS — E041 Nontoxic single thyroid nodule: Secondary | ICD-10-CM

## 2015-11-13 ENCOUNTER — Ambulatory Visit: Payer: Managed Care, Other (non HMO)

## 2015-11-20 ENCOUNTER — Ambulatory Visit: Admission: RE | Admit: 2015-11-20 | Payer: Managed Care, Other (non HMO) | Source: Ambulatory Visit

## 2015-11-27 ENCOUNTER — Ambulatory Visit: Payer: Managed Care, Other (non HMO)

## 2016-01-25 DIAGNOSIS — E669 Obesity, unspecified: Secondary | ICD-10-CM | POA: Insufficient documentation

## 2016-04-29 ENCOUNTER — Other Ambulatory Visit
Admission: RE | Admit: 2016-04-29 | Discharge: 2016-04-29 | Disposition: A | Discharge status: Home / Self Care | Payer: Managed Care, Other (non HMO) | Source: Ambulatory Visit | Attending: Otolaryngology | Admitting: Otolaryngology

## 2016-04-29 DIAGNOSIS — E041 Nontoxic single thyroid nodule: Secondary | ICD-10-CM | POA: Diagnosis present

## 2016-04-29 LAB — TSH: TSH: 2.076 u[IU]/mL (ref 0.350–4.500)

## 2016-04-29 LAB — T4, FREE: Free T4: 0.88 ng/dL (ref 0.61–1.12)

## 2016-04-30 LAB — T3, FREE: T3 FREE: 2.8 pg/mL (ref 2.0–4.4)

## 2016-06-13 ENCOUNTER — Other Ambulatory Visit: Payer: Self-pay | Admitting: Otolaryngology

## 2016-06-13 DIAGNOSIS — E041 Nontoxic single thyroid nodule: Secondary | ICD-10-CM

## 2016-06-17 ENCOUNTER — Ambulatory Visit: Payer: Managed Care, Other (non HMO)

## 2016-07-17 ENCOUNTER — Encounter: Payer: Self-pay | Admitting: Family Medicine

## 2016-07-17 ENCOUNTER — Ambulatory Visit (INDEPENDENT_AMBULATORY_CARE_PROVIDER_SITE_OTHER): Payer: Self-pay | Admitting: Family Medicine

## 2016-07-17 VITALS — BP 118/78 | HR 78 | Resp 16 | Ht 65.0 in | Wt 247.0 lb

## 2016-07-17 DIAGNOSIS — E039 Hypothyroidism, unspecified: Secondary | ICD-10-CM

## 2016-07-17 DIAGNOSIS — E041 Nontoxic single thyroid nodule: Secondary | ICD-10-CM

## 2016-07-17 DIAGNOSIS — R5381 Other malaise: Secondary | ICD-10-CM

## 2016-07-17 DIAGNOSIS — R635 Abnormal weight gain: Secondary | ICD-10-CM

## 2016-07-17 DIAGNOSIS — Z7689 Persons encountering health services in other specified circumstances: Secondary | ICD-10-CM

## 2016-07-17 DIAGNOSIS — R5382 Chronic fatigue, unspecified: Secondary | ICD-10-CM

## 2016-07-17 NOTE — Patient Instructions (Signed)
Hypothyroidism Hypothyroidism is a disorder of the thyroid. The thyroid is a large gland that is located in the lower front of the neck. The thyroid releases hormones that control how the body works. With hypothyroidism, the thyroid does not make enough of these hormones. What are the causes? Causes of hypothyroidism may include:  Viral infections.  Pregnancy.  Your own defense system (immune system) attacking your thyroid.  Certain medicines.  Birth defects.  Past radiation treatments to your head or neck.  Past treatment with radioactive iodine.  Past surgical removal of part or all of your thyroid.  Problems with the gland that is located in the center of your brain (pituitary).  What are the signs or symptoms? Signs and symptoms of hypothyroidism may include:  Feeling as though you have no energy (lethargy).  Inability to tolerate cold.  Weight gain that is not explained by a change in diet or exercise habits.  Dry skin.  Coarse hair.  Menstrual irregularity.  Slowing of thought processes.  Constipation.  Sadness or depression.  How is this diagnosed? Your health care provider may diagnose hypothyroidism with blood tests and ultrasound tests. How is this treated? Hypothyroidism is treated with medicine that replaces the hormones that your body does not make. After you begin treatment, it may take several weeks for symptoms to go away. Follow these instructions at home:  Take medicines only as directed by your health care provider.  If you start taking any new medicines, tell your health care provider.  Keep all follow-up visits as directed by your health care provider. This is important. As your condition improves, your dosage needs may change. You will need to have blood tests regularly so that your health care provider can watch your condition. Contact a health care provider if:  Your symptoms do not get better with treatment.  You are taking thyroid  replacement medicine and: ? You sweat excessively. ? You have tremors. ? You feel anxious. ? You lose weight rapidly. ? You cannot tolerate heat. ? You have emotional swings. ? You have diarrhea. ? You feel weak. Get help right away if:  You develop chest pain.  You develop an irregular heartbeat.  You develop a rapid heartbeat. This information is not intended to replace advice given to you by your health care provider. Make sure you discuss any questions you have with your health care provider. Document Released: 06/30/2005 Document Revised: 12/06/2015 Document Reviewed: 11/15/2013 Elsevier Interactive Patient Education  2017 Elsevier Inc.  

## 2016-07-17 NOTE — Progress Notes (Signed)
Name: Cassie Ruiz   MRN: [id number]    DOB: [birth year]   Date:07/17/2016       Progress Note  Subjective  Chief Complaint  Chief Complaint  Patient presents with  . Establish Care  . Hypothyroidism    Thyroid Problem  Presents for follow-up visit. Symptoms include cold intolerance, constipation, dry skin, fatigue, hair loss and weight gain. Patient reports no anxiety, depressed mood, diaphoresis, diarrhea, hoarse voice, leg swelling, menstrual problem, nail problem, palpitations, tremors, visual change or weight loss. The symptoms have been worsening.    No problem-specific Assessment & Plan notes found for this encounter.   Past Medical History:  Diagnosis Date  . Anxiety   . Breast cancer ([place])    Left  . Depression   . Hypothyroidism   . Mental disorder     Past Surgical History:  Procedure Laterality Date  . BREAST SURGERY    . TONSILLECTOMY      Family History  Problem Relation Age of Onset  . Anesthesia problems Neg Hx   . Malignant hyperthermia Neg Hx   . Pseudochol deficiency Neg Hx   . Hypotension Neg Hx     Social History   Social History  . Marital status: Married    Spouse name: N/A  . Number of children: N/A  . Years of education: N/A   Occupational History  . Not on file.   Social History Main Topics  . Smoking status: Never Smoker  . Smokeless tobacco: Never Used  . Alcohol use No  . Drug use: No  . Sexual activity: Yes    Birth control/ protection: Condom   Other Topics Concern  . Not on file   Social History Narrative  . No narrative on file    Allergies  Allergen Reactions  . Codeine Hives  . Lidocaine Rash    Topical lidocaine     Review of Systems  Constitutional: Positive for fatigue, malaise/fatigue and weight gain. Negative for chills, diaphoresis, fever and weight loss.  HENT: Negative for congestion, ear discharge, ear pain, hearing loss, hoarse voice, sore throat and tinnitus.   Eyes: Positive for blurred vision.  Negative for double vision and photophobia.  Respiratory: Negative for cough, hemoptysis, sputum production, shortness of breath, wheezing and stridor.   Cardiovascular: Negative for chest pain, palpitations, leg swelling and PND.  Gastrointestinal: Positive for constipation. Negative for abdominal pain, diarrhea, heartburn, nausea and vomiting.  Genitourinary: Negative for dysuria, flank pain, frequency, hematuria, menstrual problem and urgency.  Musculoskeletal: Negative for back pain, falls, joint pain, myalgias and neck pain.  Skin: Negative for itching and rash.  Neurological: Negative for tingling, tremors, speech change, focal weakness, seizures, loss of consciousness and headaches.  Endo/Heme/Allergies: Positive for cold intolerance. Negative for environmental allergies and polydipsia. Does not bruise/bleed easily.  Psychiatric/Behavioral: Negative for depression, hallucinations, memory loss, substance abuse and suicidal ideas. The patient has insomnia. The patient is not nervous/anxious.      Objective  Vitals:   07/17/16 1544  BP: 118/78  Pulse: 78  Resp: 16  SpO2: 98%  Weight: 247 lb (112 kg)  Height: 5\' 5"  (1.651 m)    Physical Exam  Constitutional: She is well-developed, well-nourished, and in no distress. No distress.  HENT:  Head: Normocephalic and atraumatic.  Right Ear: External ear normal.  Left Ear: External ear normal.  Nose: Nose normal.  Mouth/Throat: Oropharynx is clear and moist.  Eyes: Conjunctivae and EOM are normal. Pupils are equal, round, and reactive  to light. Right eye exhibits no discharge. Left eye exhibits no discharge.  Neck: Normal range of motion. Neck supple. No JVD present. No thyroid mass and no thyromegaly present.  Cardiovascular: Normal rate, regular rhythm, normal heart sounds and intact distal pulses.  Exam reveals no gallop and no friction rub.   No murmur heard. Pulmonary/Chest: Effort normal and breath sounds normal. She has no  wheezes. She has no rales.  Abdominal: Soft. Bowel sounds are normal. There is no hepatosplenomegaly. There is no tenderness.  Lymphadenopathy:    She has no cervical adenopathy.  Neurological: She is alert. She has normal reflexes.  Skin: Skin is warm and dry. She is not diaphoretic.  Psychiatric: Mood and affect normal.  Nursing note and vitals reviewed.     Assessment & Plan  Problem List Items Addressed This Visit      Endocrine   Hypothyroidism - Primary   Relevant Orders   Thyroid Panel With TSH    Other Visit Diagnoses    Chronic fatigue and malaise       Relevant Orders   Basic metabolic panel   CBC w/Diff/Platelet   Weight gain       Thyroid nodule       per Dr Antony Contras release of information   Establishing care with new doctor, encounter for       prefers to stay with Dr Army Melia        Dr. [facility]  07/17/16

## 2016-07-23 ENCOUNTER — Encounter: Payer: Self-pay | Admitting: Internal Medicine

## 2016-07-23 ENCOUNTER — Ambulatory Visit (INDEPENDENT_AMBULATORY_CARE_PROVIDER_SITE_OTHER): Payer: Commercial Managed Care - PPO | Admitting: Internal Medicine

## 2016-07-23 VITALS — BP 99/60 | HR 82 | Temp 97.9°F | Ht 65.0 in | Wt 243.0 lb

## 2016-07-23 DIAGNOSIS — D0512 Intraductal carcinoma in situ of left breast: Secondary | ICD-10-CM | POA: Diagnosis not present

## 2016-07-23 DIAGNOSIS — E041 Nontoxic single thyroid nodule: Secondary | ICD-10-CM | POA: Diagnosis not present
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
## 2016-12-10 ENCOUNTER — Telehealth: Payer: Self-pay

## 2016-12-10 NOTE — Telephone Encounter (Signed)
[facility] called asking [facility] to release TSH lab order. I instructed patient to come here because we never release them for [facility]. Patient came and after reviewing chart I saw patient has seen [facility] then went to [place] after not being happy with visit with Ronnald Ramp. I then saw that she was bouncing to [facility] and got same referral from them for Endocrinology just like the one we had made around same time. She had gotten TSH on 12/03/2016 also after seeing ENT [place] and when I advised that I can not order a lab she insisted and argued with me at window. I told her the doctor is on vacation and she said then why did I speak to you all and you said come get a lab done? I said I can see you had TSH already and she argued saying she needs lab order now due to her thyroid nodule juengle found. I also asked about seeing another PCP and a specialist that is already following her and she said she just needs this lab and that Claiborne Billings had told her come on over, I explained Claiborne Billings does not work here and then she said let me talk to [place]. I told her this is not that office and she went to get an order from ENT. Please review where all this patient is going as it seems as if she is not following one doctors orders here.

## 2016-12-16 ENCOUNTER — Other Ambulatory Visit: Payer: Self-pay | Admitting: Otolaryngology

## 2016-12-16 DIAGNOSIS — E041 Nontoxic single thyroid nodule: Secondary | ICD-10-CM

## 2016-12-19 ENCOUNTER — Encounter: Payer: Self-pay | Admitting: Internal Medicine

## 2016-12-22 ENCOUNTER — Ambulatory Visit: Payer: Commercial Managed Care - PPO

## 2016-12-22 ENCOUNTER — Encounter: Payer: Commercial Managed Care - PPO | Admitting: Internal Medicine

## 2016-12-24 ENCOUNTER — Ambulatory Visit: Payer: Commercial Managed Care - PPO

## 2016-12-30 ENCOUNTER — Ambulatory Visit
Admission: RE | Admit: 2016-12-30 | Discharge: 2016-12-30 | Disposition: A | Discharge status: Home / Self Care | Payer: Commercial Managed Care - PPO | Source: Ambulatory Visit | Attending: Otolaryngology | Admitting: Otolaryngology

## 2016-12-30 DIAGNOSIS — E042 Nontoxic multinodular goiter: Secondary | ICD-10-CM | POA: Insufficient documentation

## 2016-12-30 DIAGNOSIS — E041 Nontoxic single thyroid nodule: Secondary | ICD-10-CM

## 2017-07-09 ENCOUNTER — Emergency Department
Admission: EM | Admit: 2017-07-09 | Discharge: 2017-07-09 | Disposition: A | Discharge status: Home / Self Care | Payer: Commercial Managed Care - PPO | Attending: Emergency Medicine | Admitting: Emergency Medicine

## 2017-07-09 ENCOUNTER — Other Ambulatory Visit: Payer: Self-pay

## 2017-07-09 ENCOUNTER — Encounter: Payer: Self-pay | Admitting: Emergency Medicine

## 2017-07-09 DIAGNOSIS — Z590 Homelessness: Secondary | ICD-10-CM | POA: Diagnosis not present

## 2017-07-09 DIAGNOSIS — F419 Anxiety disorder, unspecified: Secondary | ICD-10-CM | POA: Insufficient documentation

## 2017-07-09 DIAGNOSIS — E039 Hypothyroidism, unspecified: Secondary | ICD-10-CM | POA: Insufficient documentation

## 2017-07-09 DIAGNOSIS — Z853 Personal history of malignant neoplasm of breast: Secondary | ICD-10-CM | POA: Diagnosis not present

## 2017-07-09 MED ORDER — LORAZEPAM 1 MG PO TABS
1.0000 mg | ORAL_TABLET | Freq: Once | ORAL | Status: AC
  Administered 2017-07-09: 1 mg via ORAL

## 2017-07-09 MED ORDER — LORAZEPAM 1 MG PO TABS
ORAL_TABLET | ORAL | Status: AC
  Filled 2017-07-09: qty 1

## 2017-07-09 NOTE — ED Notes (Signed)
Left message for social worker

## 2017-07-09 NOTE — ED Notes (Signed)
Patient is resting comfortably at this time with no signs of distress present. Will continue to monitor.   
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
## 2017-07-09 NOTE — Clinical Social Work Note (Signed)
CSW received consult that patient needed resources for homeless shelters, psychiatry counselor provided resources for patient in her chart.  CSW to sign off, please reconsult if other social work needs arise.  Jones Broom. [place], MSW, [place]  07/09/2017 9:51 AM
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
## 2017-07-09 NOTE — ED Notes (Signed)
TTS with pt, but pt asleep. TTS leaving resources on pt chart for [place] resources because pt refused [facility]

## 2017-07-09 NOTE — ED Notes (Signed)
Pt refused to change into wine colored scrubs. Pt stated the phone that she was given did not work which is why she could not call her cousin. Dr. Jimmye Norman at bedside. Pt assisted to plug her cell phone in to charge so that she can locate her cousin's phone number.
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
## 2017-07-09 NOTE — ED Notes (Signed)
Pt continuing to attempt family or friend to come pick her up for discharge.

## 2019-05-09 IMAGING — US US THYROID
1 series · 13 of 25 positions shown · non-contrast
Comparison: 04/11/2014 and previous back to 05/28/2011

CLINICAL DATA: Right nodule

EXAM:
THYROID ULTRASOUND
TECHNIQUE: Ultrasound examination of the thyroid gland and adjacent soft
tissues was performed.

[Series 1: us thyroid · 0.07mm/px · 13 of 68 slices shown]
[im 1/68]
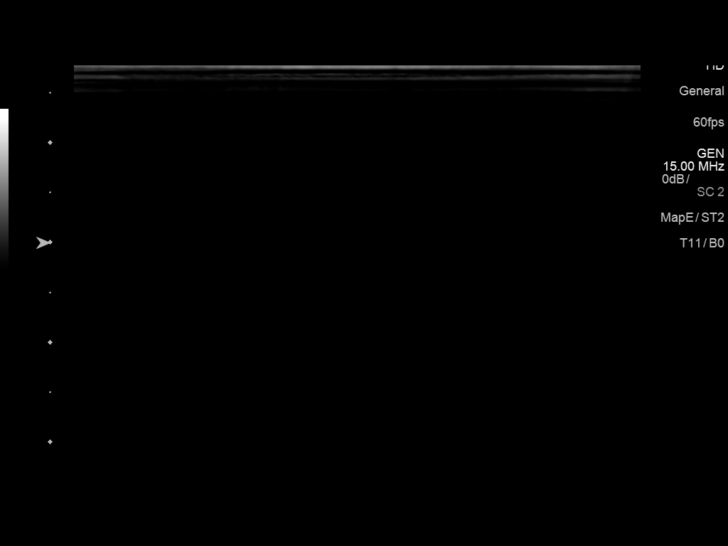
[im 6/68]
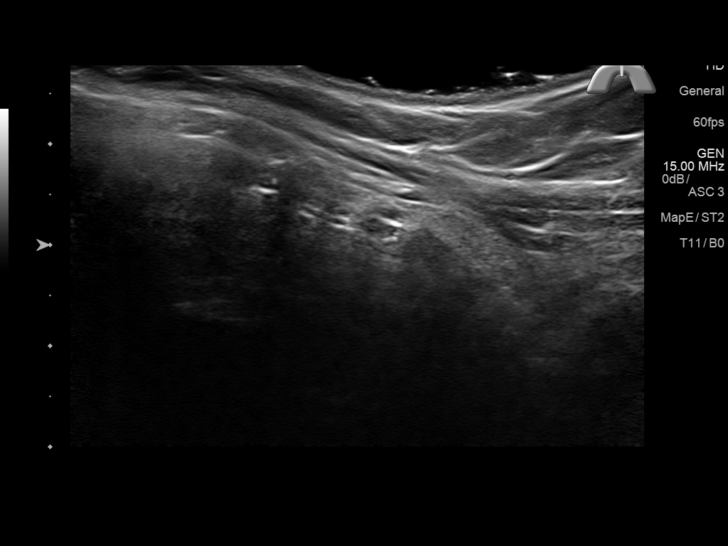
[im 12/68]
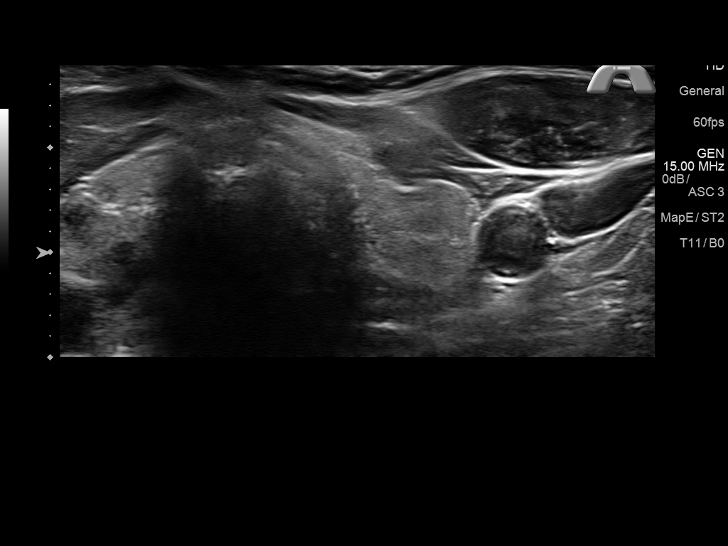
[im 17/68]
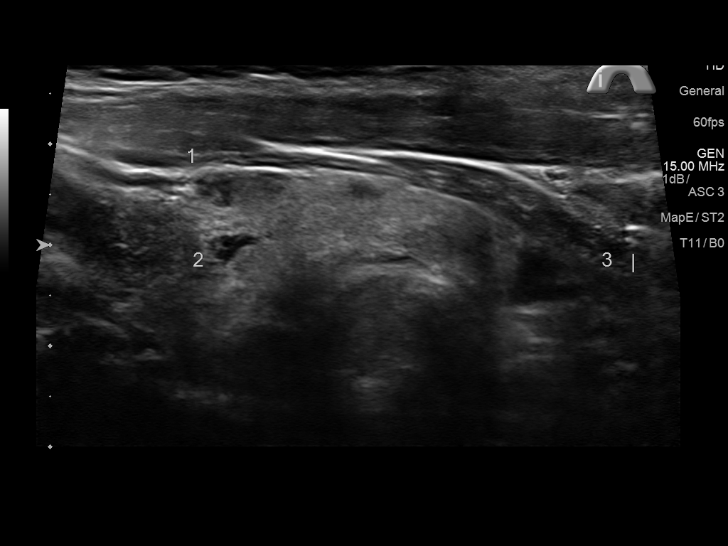
[im 23/68]
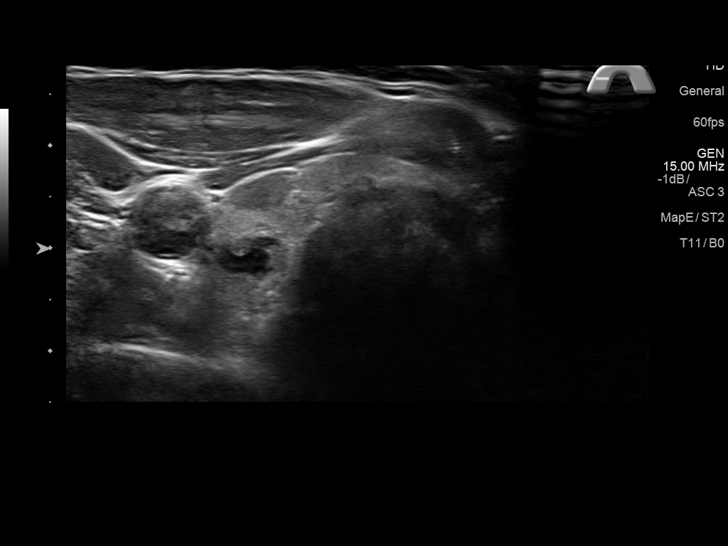
[im 28/68]
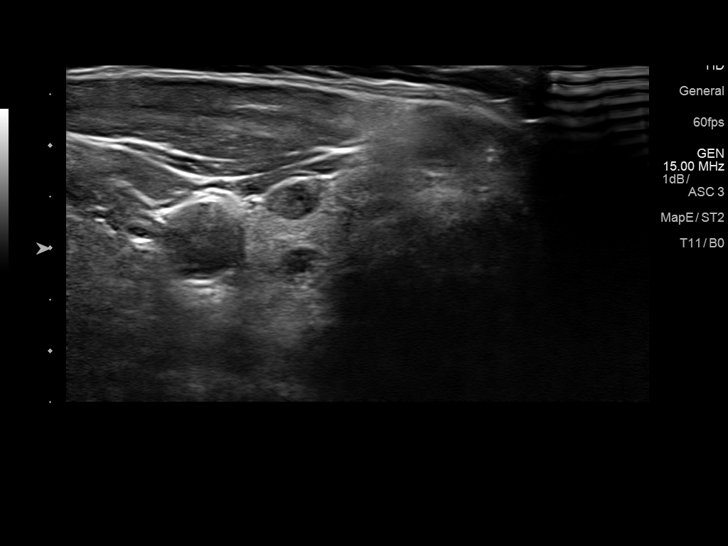
[im 34/68]
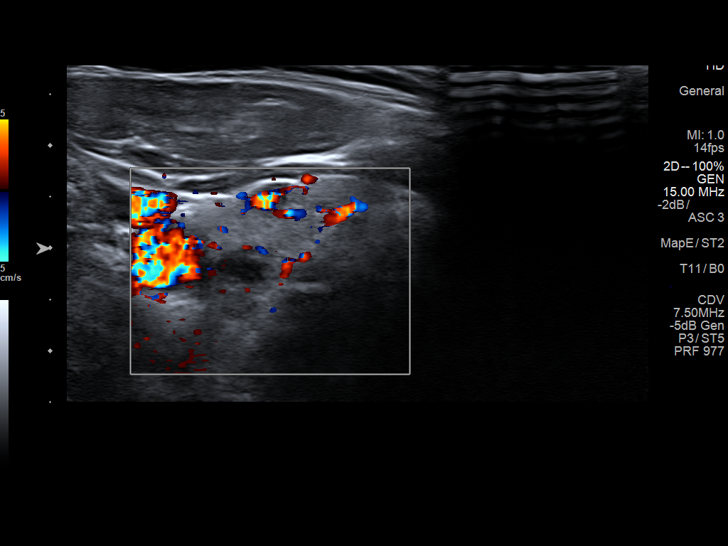
[im 40/68]
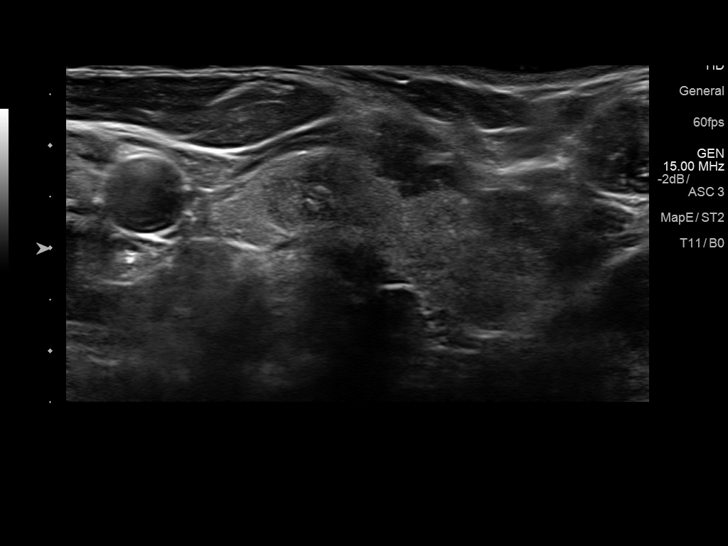
[im 45/68]
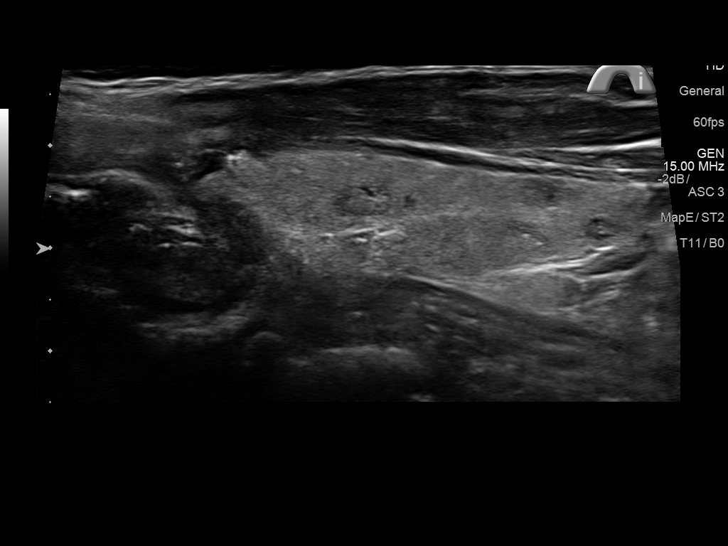
[im 51/68]
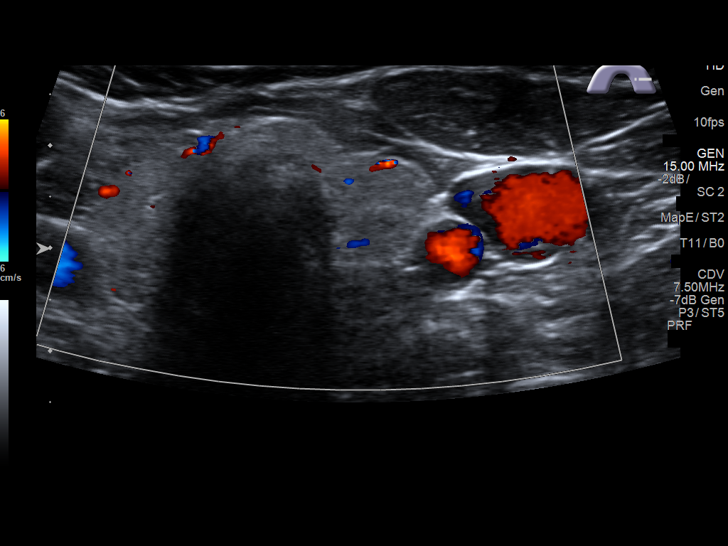
[im 56/68]
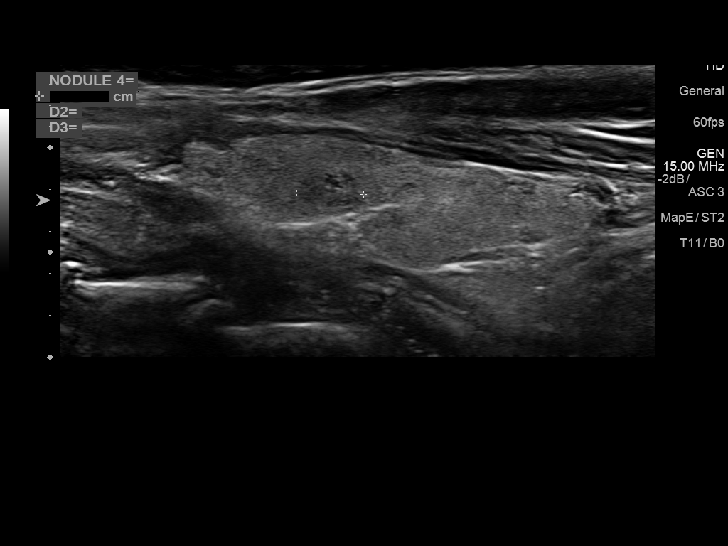
[im 62/68]
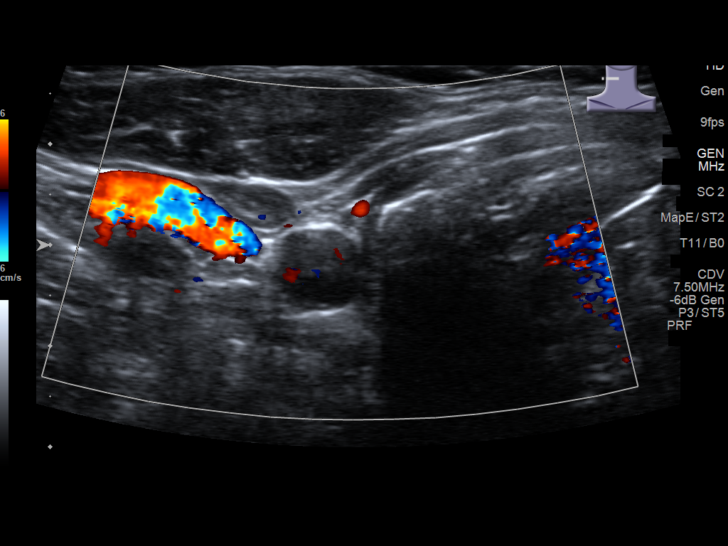
[im 68/68]
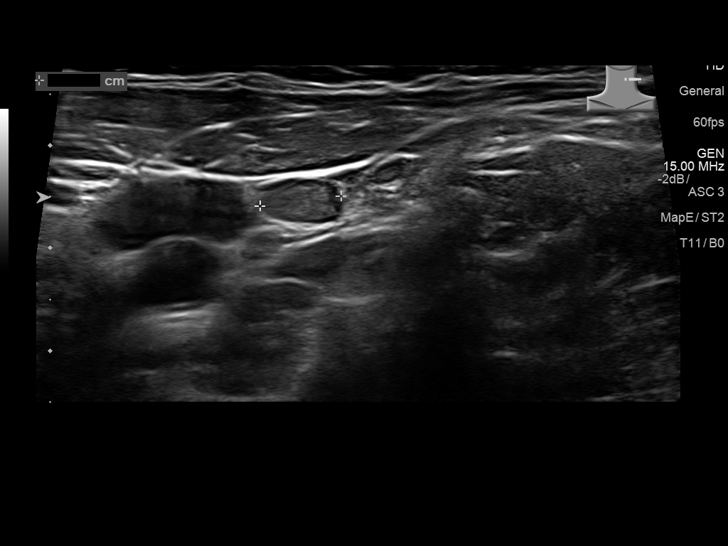

[13 of 25 positions shown; findings below may reference images not displayed]

FINDINGS: Parenchymal Echotexture: Mildly heterogenous

Isthmus: 0.4 cm thickness, previously

Right lobe: 4.2 x 1.9 x 1.1 cm, previously 4.4 x 1.7 x

Left lobe: 4.1 x 1.1 x 1.2 cm, previously 4.1 x 1.1 x

_________________________________________________________

Estimated total number of nodules >/= 1 cm: 2

Number of spongiform nodules >/=  2 cm not described below (TR1): 0

Number of mixed cystic and solid nodules >/= 1.5 cm not described
below (TR2): 0

_________________________________________________________

Nodule # 1:

Location: Right; Superior

Maximum size: 1 cm; Other 2 dimensions: 0.6 x 0.4 cm

Composition: solid/almost completely solid (2)

Echogenicity: hypoechoic (2)

Shape: not taller-than-wide (0)

Margins: smooth (0)

Echogenic foci: none (0)

ACR TI-RADS total points: 4.

ACR TI-RADS risk category: TR4 (4-6 points).

ACR TI-RADS recommendations:

*Given size (>/= 1 - 1.4 cm) and appearance, a follow-up ultrasound
in 1 year should be considered based on TI-RADS criteria.

_________________________________________________________

Nodule # 2:

Location: Right; Inferior

Maximum size: 1.5 cm; Other 2 dimensions: 1.3 x 0.7, previously
x 1.2 x 0.7 cm

Composition: mixed cystic and solid (1)

Echogenicity: hypoechoic (2)

Shape: not taller-than-wide (0)

Margins: ill-defined (0)

Echogenic foci: none (0)

ACR TI-RADS total points: 3.

ACR TI-RADS risk category: TR3 (3 points).

ACR TI-RADS recommendations:

*Given size (>/= 1.5 - 2.4 cm) and appearance, a follow-up
ultrasound in 1 year should be considered based on TI-RADS criteria.

_________________________________________________________

Several hypoechoic nodules on the left none greater than 6 mm 0.6 cm
maximum diameter.

0.7 cm complex cyst, mid right lobe
IMPRESSION: 1. Normal-sized thyroid with small bilateral nodules. None meets
criteria for biopsy.
2. Recommend 1 year follow-up surveillance ultrasound of right
superior and inferior nodules as above.

The above is in keeping with the ACR TI-RADS recommendations - [HOSPITAL] 8779;[DATE].
